# Patient Record
Sex: Female | Born: 1945 | Race: White | Hispanic: No | Smoking: Never smoker
Health system: Southern US, Community
[De-identification: ages and names within clinical notes are randomized; demographics above are authoritative.]

## PROBLEM LIST (undated history)

## (undated) DIAGNOSIS — I69359 Hemiplegia and hemiparesis following cerebral infarction affecting unspecified side: Secondary | ICD-10-CM

## (undated) DIAGNOSIS — I10 Essential (primary) hypertension: Secondary | ICD-10-CM

## (undated) DIAGNOSIS — I69328 Other speech and language deficits following cerebral infarction: Secondary | ICD-10-CM

## (undated) DIAGNOSIS — I639 Cerebral infarction, unspecified: Secondary | ICD-10-CM

## (undated) DIAGNOSIS — E7800 Pure hypercholesterolemia, unspecified: Secondary | ICD-10-CM

## (undated) DIAGNOSIS — F0390 Unspecified dementia without behavioral disturbance: Secondary | ICD-10-CM

## (undated) HISTORY — DX: Other speech and language deficits following cerebral infarction: I69.328

## (undated) HISTORY — DX: Hemiplegia and hemiparesis following cerebral infarction affecting unspecified side: I69.359

---

## 2000-11-10 ENCOUNTER — Other Ambulatory Visit: Admission: RE | Admit: 2000-11-10 | Discharge: 2000-11-10 | Payer: Self-pay | Admitting: Family Medicine

## 2001-07-13 ENCOUNTER — Ambulatory Visit (HOSPITAL_COMMUNITY): Admission: RE | Admit: 2001-07-13 | Discharge: 2001-07-13 | Payer: Self-pay | Admitting: Family Medicine

## 2001-07-13 ENCOUNTER — Encounter: Payer: Self-pay | Admitting: Family Medicine

## 2001-08-27 ENCOUNTER — Inpatient Hospital Stay (HOSPITAL_COMMUNITY): Admission: EM | Admit: 2001-08-27 | Discharge: 2001-08-28 | Payer: Self-pay | Admitting: Internal Medicine

## 2001-08-27 ENCOUNTER — Encounter: Payer: Self-pay | Admitting: Internal Medicine

## 2002-07-14 ENCOUNTER — Ambulatory Visit (HOSPITAL_COMMUNITY): Admission: RE | Admit: 2002-07-14 | Discharge: 2002-07-14 | Payer: Self-pay | Admitting: Family Medicine

## 2002-07-14 ENCOUNTER — Encounter: Payer: Self-pay | Admitting: Family Medicine

## 2003-07-18 ENCOUNTER — Ambulatory Visit (HOSPITAL_COMMUNITY): Admission: RE | Admit: 2003-07-18 | Discharge: 2003-07-18 | Payer: Self-pay | Admitting: Family Medicine

## 2004-07-25 ENCOUNTER — Ambulatory Visit (HOSPITAL_COMMUNITY): Admission: RE | Admit: 2004-07-25 | Discharge: 2004-07-25 | Payer: Self-pay | Admitting: Family Medicine

## 2005-09-30 ENCOUNTER — Ambulatory Visit (HOSPITAL_COMMUNITY): Admission: RE | Admit: 2005-09-30 | Discharge: 2005-09-30 | Payer: Self-pay | Admitting: Obstetrics and Gynecology

## 2006-10-13 ENCOUNTER — Ambulatory Visit (HOSPITAL_COMMUNITY): Admission: RE | Admit: 2006-10-13 | Discharge: 2006-10-13 | Payer: Self-pay | Admitting: Family Medicine

## 2007-11-20 ENCOUNTER — Ambulatory Visit (HOSPITAL_COMMUNITY): Admission: RE | Admit: 2007-11-20 | Discharge: 2007-11-20 | Payer: Self-pay | Admitting: Family Medicine

## 2009-06-09 ENCOUNTER — Ambulatory Visit (HOSPITAL_COMMUNITY): Admission: RE | Admit: 2009-06-09 | Discharge: 2009-06-09 | Payer: Self-pay | Admitting: Family Medicine

## 2010-05-14 ENCOUNTER — Other Ambulatory Visit (HOSPITAL_COMMUNITY): Payer: Self-pay | Admitting: Family Medicine

## 2010-05-14 ENCOUNTER — Encounter: Payer: Self-pay | Admitting: Family Medicine

## 2010-05-14 DIAGNOSIS — Z1239 Encounter for other screening for malignant neoplasm of breast: Secondary | ICD-10-CM

## 2010-06-15 ENCOUNTER — Ambulatory Visit (HOSPITAL_COMMUNITY): Payer: Self-pay

## 2010-09-07 NOTE — Discharge Summary (Signed)
[facility]  Patient:    ALONZO, LOVING Visit Number: [id number] MRN: [id number]          Service Type: MED Location: 2A A209 01 Attending Physician:  John Giovanni Dictated by:   John Giovanni, M.D. Admit Date:  08/27/2001 Discharge Date: 08/28/2001                             Discharge Summary  HISTORY:  A 65 year old was at home folding clothes and on her way to carry some folded up clothes to another room when she suddenly passed out.  She was out for a couple of seconds.  Woke right up, got up, finished folding her clothes but due to severe pain in her distal right arm, she called her family.   She has had three or four syncopal episodes over the course of her lifetime really starting about age 26.  Her sister also had similar spells as did her mother and patients two twin daughters.  Last spell she remembers about 20 years ago.  She is on vitamin E and Lipitor.  She did fracture her right wrist at 65 years old.  Otherwise, she has been healthy.  She had no premonitory signs or symptoms.  She has not even had dizzy spells over the last 20 years.  She woke up alert.  She had been turning and twisting her head and torso during the clothes folding.  PHYSICAL EXAMINATION ON ADMISSION  GENERAL:  Pleasant middle aged woman.  She really is in no acute distress, remained that way.  VITAL SIGNS:  Pulse 89, respiratory rate 20, blood pressure 111/70, temperature 99.1.  PHYSICAL EXAMINATION ON DISCHARGE  GENERAL:  Pleasant middle aged woman, good color.  HEART:  Regular rhythm, rate of 70.  LUNGS:  Clear throughout.  EXTREMITIES:  No edema of the ankles.  HOSPITAL COURSE:  She did fracture a distal right arm (x-ray results not on the chart).  She was seen by Dr. Hilda Lias for this.  He put her on an ice pack and a sling in the right arm with neurovascular checks q.12-2 hours.  She had a two day hospital course.  Was absolutely  asymptomatic, ready for discharge home her second day.  Her blood work including CBC, cardiac enzymes, LFTs was all normal.  Hemoglobin 12.6.  EKG was likewise normal as were her rhythm strips.  DISCHARGE DIAGNOSES: 1. Syncopal episode probably secondary to orthostatic hypotension since the    episode was very rapid, had no premonitory signs or symptoms, and no    postictal changes and is found in four other family members. 2. Hyperlipidemia. 3. Fracture of the distal right arm.  PLAN:  I have discussed the diagnoses at length with the patient and her daughter.  Plan is for her to continue her home medications and go home and arrange to see cardiology outpatient next week. Dictated by:   John Giovanni, M.D. Attending Physician:  John Giovanni DD:  08/28/01 TD:  08/31/01 Job: [id number]

## 2010-09-07 NOTE — Consult Note (Signed)
[facility]  Patient:    Shawna Banks, Shawna Banks Visit Number: [id number] MRN: [id number]          Service Type: MED Location: 2A A209 01 Attending Physician:  Milana Obey Dictated by:   Darreld Mclean, M.D. Admit Date:  08/27/2001 Discharge Date: 08/28/2001                            Consultation Report  INTRODUCTION:  Seen in the ER on consultation requested by Dr. Sherwood Gambler. The patient fell at home.  CHIEF COMPLAINT:  "I hurt my right wrist."  HISTORY OF PRESENT ILLNESS:  She was walking in her dining room, slipped and fell for some reason, she does not know why, landed on her right wrist. She is right-hand dominant. She has a fracture, Smith-type, so called reverse Colles fracture of the right distal radius. She may also have a small fracture of the navicular scaphoid bone, hard to say from these x-rays.  No other injuries. She does not know why she became syncopal. Drs. Sudie Bailey and Renard Matter will admit her today for further evaluation for the syncopal episode. She complains of no other pain, no other difficulty.  I have reviewed of their ER record completed by the nurse and the physician.  These are herein incorporated by reference, I will not repeat these at this time. I have seen the EKG strip as well and what labs we do have back.  Right wrist is swollen and tender, painful, decreased range of motion.  IMPRESSION:  Fracture, distal radius, right, Smith-type, reverse Colles.  I explained to the patient the findings and the need for reduction under closed means.  She appeared to understand and agree to this.  PROCEDURE:  Right wrist has been prepped and draped. Plain Xylocaine 1% was used as a hematoma block. Once this had sustained anesthesia, underwent closed reduction.  The patient was placed in a sugar-tong splint on the right. We are now awaiting post reduction x-rays. Instructions are given for ice, elevation, and sling. I will see  the patient in the office next week, she is admitted now. I will check her in the morning in the hospital. Dictated by:   Darreld Mclean, M.D. Attending Physician:  Milana Obey DD:  08/27/01 TD:  08/29/01 Job: [id number]

## 2010-09-07 NOTE — H&P (Signed)
[facility]  Patient:    Shawna Banks, Shawna Banks Visit Number: [id number] MRN: [id number]          Service Type: MED Location: 2A A209 01 Attending Physician:  Milana Obey Dictated by:   Butch Penny, M.D. Admit Date:  08/27/2001 Discharge Date: 08/28/2001                           History and Physical  HISTORY OF PRESENT ILLNESS:  The patient is a 65 year old female who was admitted through the ED after having presented there with an episode of syncope which occurred at home.  Apparently, she lost consciousness.  Had never had a previous history of any problems of this sort.  Apparently, fell and fractured her right radius.  She was seen in consultation by Dr. Hilda Lias.  FAMILY HISTORY:  Noncontributory.  SOCIAL HISTORY:  The patient does not smoke or drink alcohol.  PAST MEDICAL HISTORY:  Essentially uneventful.  No prior surgeries.  No prior heart problems.  MEDICATIONS:  Lipitor 10 mg, questionable.  ALLERGIES:  No known allergies.  REVIEW OF SYSTEMS:  HEENT:  Negative.  CARDIOPULMONARY:  No chest pain, dyspnea, cough.  GASTROINTESTINAL:  No bowel irregularity or bleeding. GENITOURINARY:  No dysuria or hematuria.  PHYSICAL EXAMINATION:  GENERAL:  Alert female.  VITAL SIGNS:  Blood pressure 111/70, respirations 20, pulse 99, temperature 99.1.  HEENT:  Eyes PERRLA.  TMs negative.  Oropharynx benign.  NECK:  Supple.  No JVD or thyroid abnormalities.  LUNGS:  Clear to P&A.  HEART:  Regular rhythm.  No murmurs.  ABDOMEN:  No palpable organs or masses.  SKIN:  Warm and dry.  EXTREMITIES:  The patient has a marked deformity of the right wrist.  ASSESSMENT:  The patient is admitted with Colles fracture right wrist and episode of syncope, etiology undetermined. Dictated by:   Butch Penny, M.D. Attending Physician:  Milana Obey DD:  08/27/01 TD:  08/29/01 Job: [id number]

## 2010-10-03 ENCOUNTER — Telehealth: Payer: Self-pay

## 2010-10-03 ENCOUNTER — Other Ambulatory Visit (HOSPITAL_COMMUNITY): Payer: Self-pay | Admitting: Family Medicine

## 2010-10-03 DIAGNOSIS — M810 Age-related osteoporosis without current pathological fracture: Secondary | ICD-10-CM

## 2010-10-03 NOTE — Telephone Encounter (Signed)
Contacted pt to schedule screening colonoscopy per Dr. Michelle Nasuti referral. She said she does not want to schedule now. She is working and trying to do all that she can, and she is the only one to pay the bills. Will call if decides to do it. Faxing info back to Dr. Sudie Bailey to make him aware of her decision.

## 2010-10-11 ENCOUNTER — Other Ambulatory Visit (HOSPITAL_COMMUNITY): Payer: Self-pay

## 2010-10-11 ENCOUNTER — Ambulatory Visit (HOSPITAL_COMMUNITY): Payer: Self-pay

## 2011-11-11 ENCOUNTER — Other Ambulatory Visit (HOSPITAL_COMMUNITY): Payer: Self-pay | Admitting: Family Medicine

## 2011-11-11 DIAGNOSIS — Z139 Encounter for screening, unspecified: Secondary | ICD-10-CM

## 2011-11-25 ENCOUNTER — Other Ambulatory Visit (HOSPITAL_COMMUNITY): Payer: Self-pay

## 2011-11-25 ENCOUNTER — Ambulatory Visit (HOSPITAL_COMMUNITY)
Admission: RE | Admit: 2011-11-25 | Discharge: 2011-11-25 | Disposition: A | Discharge status: Home / Self Care | Payer: Medicare Other | Source: Ambulatory Visit | Attending: Family Medicine | Admitting: Family Medicine

## 2011-11-25 DIAGNOSIS — Z1231 Encounter for screening mammogram for malignant neoplasm of breast: Secondary | ICD-10-CM | POA: Insufficient documentation

## 2011-11-25 DIAGNOSIS — Z139 Encounter for screening, unspecified: Secondary | ICD-10-CM

## 2012-02-12 ENCOUNTER — Telehealth: Payer: Self-pay

## 2012-02-12 NOTE — Telephone Encounter (Signed)
Called pt. She is busy now and would like to wait a couple of weeks and she will call me back.

## 2012-03-17 NOTE — Telephone Encounter (Signed)
Pt does not wish to schedule at this time. She is working some and does not want to miss out on that. She will call when she is ready. Letter to PCP.

## 2012-03-24 ENCOUNTER — Other Ambulatory Visit (HOSPITAL_COMMUNITY): Payer: Self-pay | Admitting: Family Medicine

## 2012-03-24 DIAGNOSIS — Z139 Encounter for screening, unspecified: Secondary | ICD-10-CM

## 2012-04-06 ENCOUNTER — Ambulatory Visit (HOSPITAL_COMMUNITY)
Admission: RE | Admit: 2012-04-06 | Discharge: 2012-04-06 | Disposition: A | Discharge status: Home / Self Care | Payer: Medicare Other | Source: Ambulatory Visit | Attending: Family Medicine | Admitting: Family Medicine

## 2012-04-06 DIAGNOSIS — M810 Age-related osteoporosis without current pathological fracture: Secondary | ICD-10-CM | POA: Insufficient documentation

## 2012-04-06 DIAGNOSIS — Z139 Encounter for screening, unspecified: Secondary | ICD-10-CM

## 2012-11-05 ENCOUNTER — Other Ambulatory Visit (HOSPITAL_COMMUNITY): Payer: Self-pay | Admitting: Family Medicine

## 2012-11-05 DIAGNOSIS — Z139 Encounter for screening, unspecified: Secondary | ICD-10-CM

## 2012-11-23 ENCOUNTER — Ambulatory Visit (HOSPITAL_COMMUNITY): Payer: Medicare Other

## 2012-11-26 ENCOUNTER — Ambulatory Visit (HOSPITAL_COMMUNITY)
Admission: RE | Admit: 2012-11-26 | Discharge: 2012-11-26 | Disposition: A | Discharge status: Home / Self Care | Payer: Medicare Other | Source: Ambulatory Visit | Attending: Family Medicine | Admitting: Family Medicine

## 2012-11-26 DIAGNOSIS — Z139 Encounter for screening, unspecified: Secondary | ICD-10-CM

## 2012-11-26 DIAGNOSIS — Z1231 Encounter for screening mammogram for malignant neoplasm of breast: Secondary | ICD-10-CM | POA: Insufficient documentation

## 2013-04-02 ENCOUNTER — Other Ambulatory Visit: Payer: Self-pay | Admitting: Neurology

## 2013-04-02 DIAGNOSIS — F0390 Unspecified dementia without behavioral disturbance: Secondary | ICD-10-CM

## 2013-04-08 ENCOUNTER — Ambulatory Visit (HOSPITAL_COMMUNITY)
Admission: RE | Admit: 2013-04-08 | Discharge: 2013-04-08 | Disposition: A | Discharge status: Home / Self Care | Payer: Medicare Other | Source: Ambulatory Visit | Attending: Neurology | Admitting: Neurology

## 2013-04-08 DIAGNOSIS — F0390 Unspecified dementia without behavioral disturbance: Secondary | ICD-10-CM

## 2013-04-08 DIAGNOSIS — I6789 Other cerebrovascular disease: Secondary | ICD-10-CM | POA: Insufficient documentation

## 2013-04-08 DIAGNOSIS — G319 Degenerative disease of nervous system, unspecified: Secondary | ICD-10-CM | POA: Insufficient documentation

## 2014-02-21 ENCOUNTER — Encounter (HOSPITAL_COMMUNITY): Payer: Self-pay | Admitting: *Deleted

## 2014-02-21 ENCOUNTER — Emergency Department (HOSPITAL_COMMUNITY)
Admission: EM | Admit: 2014-02-21 | Discharge: 2014-02-21 | Disposition: A | Discharge status: Home / Self Care | Payer: Medicare Other | Attending: Emergency Medicine | Admitting: Emergency Medicine

## 2014-02-21 ENCOUNTER — Emergency Department (HOSPITAL_COMMUNITY): Payer: Medicare Other

## 2014-02-21 DIAGNOSIS — S4992XA Unspecified injury of left shoulder and upper arm, initial encounter: Secondary | ICD-10-CM | POA: Diagnosis present

## 2014-02-21 DIAGNOSIS — Y92008 Other place in unspecified non-institutional (private) residence as the place of occurrence of the external cause: Secondary | ICD-10-CM | POA: Diagnosis not present

## 2014-02-21 DIAGNOSIS — F0390 Unspecified dementia without behavioral disturbance: Secondary | ICD-10-CM | POA: Insufficient documentation

## 2014-02-21 DIAGNOSIS — R21 Rash and other nonspecific skin eruption: Secondary | ICD-10-CM | POA: Insufficient documentation

## 2014-02-21 DIAGNOSIS — E780 Pure hypercholesterolemia: Secondary | ICD-10-CM | POA: Insufficient documentation

## 2014-02-21 DIAGNOSIS — I10 Essential (primary) hypertension: Secondary | ICD-10-CM | POA: Insufficient documentation

## 2014-02-21 DIAGNOSIS — W109XXA Fall (on) (from) unspecified stairs and steps, initial encounter: Secondary | ICD-10-CM | POA: Insufficient documentation

## 2014-02-21 DIAGNOSIS — Y9389 Activity, other specified: Secondary | ICD-10-CM | POA: Insufficient documentation

## 2014-02-21 DIAGNOSIS — T1490XA Injury, unspecified, initial encounter: Secondary | ICD-10-CM

## 2014-02-21 DIAGNOSIS — S42292A Other displaced fracture of upper end of left humerus, initial encounter for closed fracture: Secondary | ICD-10-CM | POA: Diagnosis not present

## 2014-02-21 DIAGNOSIS — Z79899 Other long term (current) drug therapy: Secondary | ICD-10-CM | POA: Insufficient documentation

## 2014-02-21 HISTORY — DX: Unspecified dementia, unspecified severity, without behavioral disturbance, psychotic disturbance, mood disturbance, and anxiety: F03.90

## 2014-02-21 HISTORY — DX: Pure hypercholesterolemia, unspecified: E78.00

## 2014-02-21 HISTORY — DX: Essential (primary) hypertension: I10

## 2014-02-21 MED ORDER — HYDROMORPHONE HCL 1 MG/ML IJ SOLN
0.5000 mg | Freq: Once | INTRAMUSCULAR | Status: AC
  Administered 2014-02-21: 0.5 mg via INTRAVENOUS
  Filled 2014-02-21: qty 1

## 2014-02-21 MED ORDER — OXYCODONE-ACETAMINOPHEN 5-325 MG PO TABS: 1.0000 | ORAL_TABLET | ORAL | Status: DC | PRN

## 2014-02-21 MED ORDER — SODIUM CHLORIDE 0.9 % IV BOLUS (SEPSIS)
500.0000 mL | Freq: Once | INTRAVENOUS | Status: AC
  Administered 2014-02-21: 500 mL via INTRAVENOUS

## 2014-02-21 MED ORDER — ONDANSETRON HCL 4 MG/2ML IJ SOLN
4.0000 mg | Freq: Once | INTRAMUSCULAR | Status: DC
  Filled 2014-02-21: qty 2

## 2014-02-21 MED ORDER — BACITRACIN ZINC 500 UNIT/GM EX OINT
TOPICAL_OINTMENT | CUTANEOUS | Status: AC
  Administered 2014-02-21: 1
  Filled 2014-02-21: qty 0.9

## 2014-02-21 MED ORDER — MORPHINE SULFATE 4 MG/ML IJ SOLN
4.0000 mg | Freq: Once | INTRAMUSCULAR | Status: AC
  Administered 2014-02-21: 4 mg via INTRAVENOUS
  Filled 2014-02-21: qty 1

## 2014-02-21 MED ORDER — ONDANSETRON HCL 4 MG/2ML IJ SOLN
4.0000 mg | Freq: Once | INTRAMUSCULAR | Status: AC
  Administered 2014-02-21: 4 mg via INTRAVENOUS

## 2014-02-21 NOTE — ED Notes (Signed)
MD at bedside. 

## 2014-02-21 NOTE — Discharge Instructions (Signed)
You have fractured your humerus bone. Sling, ice, pain medicine, follow-up with orthopedic doctor. Phone number given.

## 2014-02-21 NOTE — ED Provider Notes (Signed)
CSN: [id number]     Arrival date & time 02/21/14  [id number] History  This chart was scribed for Shawna HutchingBrian Brooks Stotz, MD by Tonye RoyaltyJoshua Chen, ED Scribe. This patient was seen in room APA14/APA14 and the patient's care was started at 9:33 AM.    Chief Complaint  Patient presents with  . Shoulder Injury   The history is provided by the patient. No language interpreter was used.    HPI Comments: Shawna Banks is a 68 y.o. female who presents to the Emergency Department complaining of left shoulder pain since falling down a few steps and landing on concrete. She also reports a wound to her right forearm sustained on falling. She denies pain to her hips, knees, or elsewhere. She denies striking her head or LOC.no head or neck trauma. Severity is moderate. Positioning and palpation makes pain worse.  Past Medical History  Diagnosis Date  . Hypertension   . Hypercholesteremia   . Dementia    History reviewed. No pertinent past surgical history. No family history on file. History  Substance Use Topics  . Smoking status: Never Smoker   . Smokeless tobacco: Not on file  . Alcohol Use: No   OB History    No data available     Review of Systems A complete 10 system review of systems was obtained and all systems are negative except as noted in the HPI and PMH.   Allergies  Review of patient's allergies indicates no known allergies.  Home Medications   Prior to Admission medications   Medication Sig Start Date End Date Taking? Authorizing Provider  donepezil (ARICEPT) 10 MG tablet Take 10 mg by mouth at bedtime.   Yes Historical Provider, MD  enalapril (VASOTEC) 20 MG tablet Take 20 mg by mouth daily.   Yes Historical Provider, MD  simvastatin (ZOCOR) 40 MG tablet Take 40 mg by mouth daily.   Yes Historical Provider, MD  Vitamin D, Ergocalciferol, (DRISDOL) 50000 UNITS CAPS capsule Take 50,000 Units by mouth every 7 (seven) days.   Yes Historical Provider, MD  Cholecalciferol (VITAMIN D-3) 1000 UNITS  CAPS Take 1 capsule by mouth daily.    Historical Provider, MD  oxyCODONE-acetaminophen (PERCOCET) 5-325 MG per tablet Take 1 tablet by mouth every 4 (four) hours as needed. 02/21/14   Shawna HutchingBrian Britney Newstrom, MD   BP 177/80 mmHg  Pulse 83  Temp(Src) 98.1 F (36.7 C) (Oral)  Resp 16  Ht 5\' 4"  (1.626 m)  Wt 125 lb (56.7 kg)  BMI 21.45 kg/m2  SpO2 99% Physical Exam  Constitutional: She is oriented to person, place, and time. She appears well-developed and well-nourished.  HENT:  Head: Normocephalic and atraumatic.  Eyes: Conjunctivae and EOM are normal. Pupils are equal, round, and reactive to light.  Neck: Normal range of motion. Neck supple.  Cardiovascular: Normal rate, regular rhythm and normal heart sounds.   Pulmonary/Chest: Effort normal and breath sounds normal.  Abdominal: Soft. Bowel sounds are normal.  Musculoskeletal: Normal range of motion.  Fullness in her left anterior shoulder  Neurological: She is alert and oriented to person, place, and time.  Skin: Skin is warm and dry.  3cm superficial gash on right posterior medial forearm  Psychiatric: She has a normal mood and affect. Her behavior is normal.  Nursing note and vitals reviewed.   ED Course  Procedures (including critical care time)  DIAGNOSTIC STUDIES: Oxygen Saturation is 99% on room air, normal by my interpretation.    COORDINATION OF CARE: 9:37 AM Discussed  treatment plan with patient at beside, including x-ray and pain medication. Discussed with them the possibility that her shoulder is dislocated, which would require reduction. The patient agrees with the plan and has no further questions at this time.   Labs Review Labs Reviewed - No data to display  Imaging Review Dg Shoulder Left  02/21/2014   CLINICAL DATA:  Status post fall down steps at home this morning striking concrete; now with limited range of motion as well shoulder pain  EXAM: LEFT SHOULDER - 2+ VIEW  COMPARISON:  None.  FINDINGS: The patient has  sustained an acute impacted fracture of the neck of the humerus. There is no dislocation of the glenohumeral joint. The bony glenoid as well as the coracoid, acromion, and visualized portions of the left clavicle are intact. The body of the scapula is normal where visualized.  IMPRESSION: The patient has sustained an acute comminuted impacted fracture of the left humeral neck.   Electronically Signed   By: David  SwazilandJordan   On: 02/21/2014 10:04     EKG Interpretation None      MDM   Final diagnoses:  Humeral head fracture, left, closed, initial encounter    Plain films of left shoulder show a comminuted impacted fracture of the left humeral neck. Sling immobilizer, ice, pain medication, referral to orthopedics. Discussed with patient and her neighbor    Shawna HutchingBrian Jaquese Irving, MD 02/21/14 [id number]

## 2014-02-21 NOTE — ED Notes (Signed)
CSM remains intact after application of shoulder immobilizer.

## 2014-02-21 NOTE — ED Notes (Signed)
Pt fell out of back door, down ~ 4 steps and landed on concrete. Pt states pain to left shoulder. Abrasion to right forearm.

## 2014-02-28 ENCOUNTER — Ambulatory Visit (INDEPENDENT_AMBULATORY_CARE_PROVIDER_SITE_OTHER): Payer: Medicare Other | Admitting: Orthopedic Surgery

## 2014-02-28 ENCOUNTER — Encounter: Payer: Self-pay | Admitting: Orthopedic Surgery

## 2014-02-28 VITALS — BP 160/101 | Ht 64.0 in | Wt 125.0 lb

## 2014-02-28 DIAGNOSIS — S42202A Unspecified fracture of upper end of left humerus, initial encounter for closed fracture: Secondary | ICD-10-CM

## 2014-02-28 DIAGNOSIS — S42209A Unspecified fracture of upper end of unspecified humerus, initial encounter for closed fracture: Secondary | ICD-10-CM | POA: Insufficient documentation

## 2014-02-28 MED ORDER — OXYCODONE-ACETAMINOPHEN 5-325 MG PO TABS: 1.0000 | ORAL_TABLET | ORAL | Status: DC | PRN

## 2014-02-28 NOTE — Progress Notes (Signed)
Patient ID: Shawna FlockBarbara C Nazareno, female   DOB: Jul 07, 1945, 68 y.o.   MRN: [id number] Patient ID: Shawna FlockBarbara C Urquiza, female   DOB: Jul 07, 1945, 68 y.o.   MRN: [id number]  Chief Complaint  Patient presents with  . Arm Injury    LEFT HUMERUS FRACTURE, DOI 02/21/14    HPI Shawna FlockBarbara C Pinon is a 68 y.o. female.  HPI    Patient ID: Shawna FlockBarbara C Hudock, female   DOB: Jul 07, 1945, 68 y.o.   MRN: [id number] Chief Complaint  Patient presents with  . Arm Injury    LEFT HUMERUS FRACTURE, DOI 02/21/14    History 68 year old female presents with a left proximal humerus fracture sustained on 02/21/2014 secondary to a fall down some stairs at her home. She complains of aching pain associated with swelling of the left arm and 6 out of 10 intensity. Her pain is relieved by Percocet and worse with movement. The patient's review of systems is reported as night sweats and all others were negative.  Past Medical History  Diagnosis Date  . Hypertension   . Hypercholesteremia   . Dementia     History reviewed. No pertinent past surgical history.  History reviewed. No pertinent family history.  Social History History  Substance Use Topics  . Smoking status: Never Smoker   . Smokeless tobacco: Not on file  . Alcohol Use: No    No Known Allergies  Current Outpatient Prescriptions  Medication Sig Dispense Refill  . Cholecalciferol (VITAMIN D-3) 1000 UNITS CAPS Take 1 capsule by mouth daily.    Marland Kitchen. donepezil (ARICEPT) 10 MG tablet Take 10 mg by mouth at bedtime.    . enalapril (VASOTEC) 20 MG tablet Take 20 mg by mouth daily.    Marland Kitchen. oxyCODONE-acetaminophen (PERCOCET) 5-325 MG per tablet Take 1 tablet by mouth every 4 (four) hours as needed. 25 tablet 0  . simvastatin (ZOCOR) 40 MG tablet Take 40 mg by mouth daily.    . Vitamin D, Ergocalciferol, (DRISDOL) 50000 UNITS CAPS capsule Take 50,000 Units by mouth every 7 (seven) days.     No current facility-administered medications for this visit.    Review of  Systems Review of Systems  Blood pressure 160/101, height 5\' 4"  (1.626 m), weight 125 lb (56.7 kg).  Physical Exam Physical Exam BP 160/101 mmHg  Ht 5\' 4"  (1.626 m)  Wt 125 lb (56.7 kg)  BMI 21.45 kg/m2 The patient's frame is small she has no developmental abnormalities she is oriented to person place and time her mood is pleasant. She ambulates without assistive device. Her left proximal humerus is tender and swollen just decreased range of motion there as well as the elbow and hand with swelling noted in the hand and ecchymosis tracking down the arm to the area of the elbow. Her motor exam reveals normal muscle tone in the left arm with normal range of motion strength in her left hand skin as described pulses are normal sensation is normal over the left proximal shoulder deltoid and upper arm  Data Reviewed Independent x-ray analysis includes axillary and AP lateral views of the left shoulder nondisplaced proximal humerus fracture which meets the non-operative criteria  Assessment    Non-displaced proximal humerus fracture treated closed methods with sling-and-swathe     Plan    3 weeks x-ray continue sling and swathe continue Percocet for pain No orders of the defined types were placed in this encounter.           Fuller CanadaStanley Harrison 02/28/2014, 3:39 PM

## 2014-02-28 NOTE — Progress Notes (Signed)
Patient ID: Shawna FlockBarbara C Banks, female   DOB: Sep 04, 1945, 68 y.o.   MRN: [id number] Chief Complaint  Patient presents with  . Arm Injury    LEFT HUMERUS FRACTURE, DOI 02/21/14    History 68 year old female presents with a left proximal humerus fracture sustained on 02/21/2014 secondary to a fall down some stairs at her home. She complains of aching pain associated with swelling of the left arm and 6 out of 10 intensity. Her pain is relieved by Percocet and worse with movement. The patient's review of systems is reported as night sweats and all others were negative.

## 2014-03-22 ENCOUNTER — Ambulatory Visit (INDEPENDENT_AMBULATORY_CARE_PROVIDER_SITE_OTHER): Payer: Medicare Other

## 2014-03-22 ENCOUNTER — Encounter: Payer: Self-pay | Admitting: Orthopedic Surgery

## 2014-03-22 ENCOUNTER — Ambulatory Visit (INDEPENDENT_AMBULATORY_CARE_PROVIDER_SITE_OTHER): Payer: Self-pay | Admitting: Orthopedic Surgery

## 2014-03-22 VITALS — BP 152/82 | Ht 64.0 in | Wt 125.0 lb

## 2014-03-22 DIAGNOSIS — S4292XD Fracture of left shoulder girdle, part unspecified, subsequent encounter for fracture with routine healing: Secondary | ICD-10-CM

## 2014-03-22 DIAGNOSIS — S42202D Unspecified fracture of upper end of left humerus, subsequent encounter for fracture with routine healing: Secondary | ICD-10-CM

## 2014-03-22 NOTE — Progress Notes (Signed)
Patient ID: Dustin FlockBarbara C Banks, female   DOB: May 25, 1945, 68 y.o.   MRN: [id number] Chief Complaint  Patient presents with  . Follow-up    3 week recheck + xray Left humerus fracture, DOI 02/21/14   BP 152/82 mmHg  Ht 5\' 4"  (1.626 m)  Wt 125 lb (56.7 kg)  BMI 21.45 kg/m2 Chief Complaint  Patient presents with  . Follow-up    3 week recheck + xray Left humerus fracture, DOI 02/21/14    Patient has had x-ray today and her x-ray is stable proximal humerus fracture with appropriate healing  She was treated with a shoulder immobilizer  She is taking minimal pain medication  She's neurovascularly intact  I interpret her x-ray is healing fracture proximal humerus stable  Start therapy  Orders Placed This Encounter  Procedures  . DG Shoulder Left    Standing Status: Future     Number of Occurrences: 1     Standing Expiration Date: 05/24/2015    Order Specific Question:  Reason for Exam (SYMPTOM  OR DIAGNOSIS REQUIRED)    Answer:  shoulder fracture    Order Specific Question:  Preferred imaging location?    Answer:  External  . Ambulatory referral to Occupational Therapy    Referral Priority:  Routine    Referral Type:  Occupational Therapy    Referral Reason:  Specialty Services Required    Requested Specialty:  Occupational Therapy    Number of Visits Requested:  1   Return 6 weeks check range of motion

## 2014-03-22 NOTE — Patient Instructions (Signed)
Call to arrange therapy at [facility] outpatient dept 

## 2014-03-25 ENCOUNTER — Encounter (HOSPITAL_COMMUNITY): Payer: Self-pay

## 2014-03-25 ENCOUNTER — Ambulatory Visit (HOSPITAL_COMMUNITY)
Admission: RE | Admit: 2014-03-25 | Discharge: 2014-03-25 | Disposition: A | Discharge status: Home / Self Care | Payer: Medicare Other | Source: Ambulatory Visit | Attending: Orthopedic Surgery | Admitting: Orthopedic Surgery

## 2014-03-25 DIAGNOSIS — R29898 Other symptoms and signs involving the musculoskeletal system: Secondary | ICD-10-CM

## 2014-03-25 DIAGNOSIS — Z5189 Encounter for other specified aftercare: Secondary | ICD-10-CM | POA: Insufficient documentation

## 2014-03-25 DIAGNOSIS — M25612 Stiffness of left shoulder, not elsewhere classified: Secondary | ICD-10-CM | POA: Diagnosis not present

## 2014-03-25 DIAGNOSIS — M6281 Muscle weakness (generalized): Secondary | ICD-10-CM | POA: Insufficient documentation

## 2014-03-25 DIAGNOSIS — M25512 Pain in left shoulder: Secondary | ICD-10-CM | POA: Insufficient documentation

## 2014-03-25 NOTE — Patient Instructions (Addendum)
SHOULDER: Flexion On Table   Place hands on table, elbows straight. Lean forward so that hand slide forward on the table.  Smooth stretch. Complete for 1-2 minutes, 2-3 times per day.   Abduction (Passive)   With arm out to side, resting on table, lean to the side, stretching arm away from the body. Smooth stretch. Complete for 1-2 minutes, 2-3 times per day.  Due to increased pain, complete in standing at kitchen counter initially.  Copyright  VHI. All rights reserved.     Internal Rotation (Assistive)   Seated with elbow bent at right angle and held against side, slide arm on table surface in an inward arc. Right arm to provide assist to left as needed. Smooth stretch. Complete for 1-2 minutes, 2-3 times per day. Activity: Use this motion to brush crumbs off the table.  Copyright  VHI. All rights reserved.     Marry GuanMarie Rawlings Kassadi Presswood, MS, OTR/L [facility] [phone]

## 2014-03-25 NOTE — Therapy (Signed)
[facility] [address] [place], [place], [address] Phone: [phone]   Fax:  [phone]  Occupational Therapy Evaluation  Patient Details  Name: Shawna Banks MRN: [id number] Date of Birth: July 13, 1945  Encounter Date: 03/25/2014      OT End of Session - 03/25/14 1159    Visit Number 1   Number of Visits 24   Date for OT Re-Evaluation 04/22/14   Authorization Type AARP Medicare Complete   Authorization Time Period Before 10th visit   Authorization - Visit Number 1   Authorization - Number of Visits 10   OT Start Time 1107   OT Stop Time 1141   OT Time Calculation (min) 34 min   Activity Tolerance Patient tolerated treatment well   Behavior During Therapy [facility] for tasks assessed/performed      Past Medical History  Diagnosis Date  . Hypertension   . Hypercholesteremia   . Dementia     No past surgical history on file.  There were no vitals taken for this visit.  Visit Diagnosis:  Pain in joint, shoulder region, left  Shoulder weakness  Decreased range of motion of shoulder, left      Subjective Assessment - 03/25/14 1146    Symptoms "i just fell. not down the stiars. Just on my arm."   Pertinent History Pt is 68 yo female presenting to outpatient OT s/p left proximal humerus fracture on november 2nd.  pt had gone out her back door and fell on the concrete outside. pt is uncertain as to what happened, but did fall on her L arm.  A friend drove pt to the hospital, where pt recieved w-rays and pain medication. Routine healing underway.  pt is left handed and currenlty has deficits in all ADLs and IADLs.   Limitations progress as tolerated   Special Tests FOTO 38/100   Patient Stated Goals Get so i can use my arm, so that I can write a check   Currently in Pain? No/denies  "it can get to hurting quite a bit"          [facility] Assessment - 03/25/14 1149    Assessment   Diagnosis Left proximal humerus fracture   Onset Date  02/21/14   Precautions   Precautions None   Restrictions   Weight Bearing Restrictions No   Balance Screen   Has the patient fallen in the past 6 months Yes   How many times? 1   Has the patient had a decrease in activity level because of a fear of falling?  No   Is the patient reluctant to leave their home because of a fear of falling?  No   Home  Environment   Family/patient expects to be discharged to: Private residence   Living Arrangements Alone   Available Help at Discharge --  daughters live within 20 minutes   Prior Function   Level of Independence Independent with basic ADLs;Independent with homemaking with ambulation   Vocation Retired   [employer]er[employer]ocation Requirements Formerly at [employer] and [employer]   Leisure walking, shopping   ADL   ADL comments Pt is left handed and is currently not able to use LUE for ADLs. Pt is using RUE for eating, brushing her teeth, doing her hair.  Pt cannot lieft anyting with LUE.   Written Expression   Dominant Hand Left   Vision - History   Baseline Vision Wears glasses all the time   Vision Assessment   Eye  Alignment Within Functional Limits   Cognition   Overall Cognitive Status Within Functional Limits for tasks assessed  WFL, with hx of dementia   Observation/Other Assessments   Observations pt has max fascial restrictions in LUE bicep region.  pt has mod restrictions in upper trap region   Focus on Therapeutic Outcomes (FOTO)  FOTO 38/100 (62% limited)   Sensation   Light Touch Appears Intact   Coordination   Gross Motor Movements are Fluid and Coordinated Not tested   Fine Motor Movements are Fluid and Coordinated Yes   AROM   Overall AROM  Deficits   Overall AROM Comments Not assessed this session   PROM   Overall PROM  Deficits   Overall PROM Comments Assessed in supine, with ER/IR adducted   Left Shoulder Flexion 15 Degrees   Left Shoulder ABduction 45 Degrees   Left Shoulder Internal Rotation 93 Degrees   Left  Shoulder External Rotation 19 Degrees            OT Education - 03/25/14 1158    Education provided Yes   Education Details table slides   Person(s) Educated Patient   Methods Explanation;Demonstration;Handout   Comprehension Verbalized understanding;Returned demonstration          OT Short Term Goals - 03/25/14 1203    OT SHORT TERM GOAL #1   Title Pt will be independent in HEP   Time 4   Period Weeks   Status New   OT SHORT TERM GOAL #2   Title Pt will imporve LUE PROM to [facility] for improved ability to write checks   Time 4   Period Weeks   Status New   OT SHORT TERM GOAL #3   Title Pt will decrease fascial restictions to min-mod in LUE for decreased pain with movement   Time 4   Period Weeks   Status New   OT SHORT TERM GOAL #4   Title Pt will decrease pain in LUE during daily tasks to less than 4/10   Time 4   Period Weeks   Status New          OT Long Term Goals - 03/25/14 1204    OT LONG TERM GOAL #1   Title Pt will achieve highest level of functioning in all ADL, IADL, and leisure tasks using LUE as dominant   Time 8   Period Weeks   Status New   OT LONG TERM GOAL #2   Title Pt will achieve LUE AROM to near [facility] for improved ability to fix her hair using her LUE   Time 8   Period Weeks   Status New   OT LONG TERM GOAL #3   Title Pt will improve strength in LUE to atleast 4/5 for improved abilty to reach into kitchen cabinets   Time 8   Period Weeks   Status New   OT LONG TERM GOAL #4   Title Pt will decrease fascial restictions to minimal in LUE for decreased pain overall during daily tasks.   OT LONG TERM GOAL #5   Title Pt will report pain in LUE during daily tasks of less than 2/10.   Time 8   Period Weeks   Status New          Plan - 03/25/14 1159    Clinical Impression Statement Pt is presenting to outpatient OT d/p left proximal humerus fracture.  Pt has decreased ROM, decreased strength, incresaed fascial restrictions, increased pain  with motion, and  decerased overall LUE functional use, resulting in decreased ADL status.   Rehab Potential Good   OT Frequency 3x / week   OT Duration 8 weeks   OT Treatment/Interventions Self-care/ADL training;Patient/family education;Therapeutic exercise;Therapeutic exercises;Therapeutic activities;Manual Therapy;DME and/or AE instruction;Electrical Stimulation;Passive range of motion;Ultrasound   Plan Pt will benefit from skilled OT services to decerase pain, improve ROM, decrease fascial restictions, increase strength, and promote imporved overall LUE functional use.      Treatment Plan: MFR and manual stretching, PROM, AAROM, AROM, scapular strengthening, proximal shoudler strengthening, general strengthening   OT Home Exercise Plan table slides   Consulted and Agree with Plan of Care Patient          G-Codes - 03/25/14 1206    Functional Limitation Self care   Self Care Current Status [phone]([id number]) At least 60 percent but less than 80 percent impaired, limited or restricted   Self Care Goal Status ([phone]) At least 20 percent but less than 40 percent impaired, limited or restricted         Problem List Patient Active Problem List   Diagnosis Date Noted  . Proximal humerus fracture 02/28/2014    Marry GuanMarie Rawlings Linsey Hirota, MS, OTR/L [facility] [phone] 03/25/2014, 12:11 PM

## 2014-03-29 ENCOUNTER — Encounter (HOSPITAL_COMMUNITY): Payer: Self-pay

## 2014-03-29 ENCOUNTER — Ambulatory Visit (HOSPITAL_COMMUNITY)
Admission: RE | Admit: 2014-03-29 | Discharge: 2014-03-29 | Disposition: A | Discharge status: Home / Self Care | Payer: Medicare Other | Source: Ambulatory Visit | Attending: Orthopedic Surgery | Admitting: Orthopedic Surgery

## 2014-03-29 DIAGNOSIS — M25512 Pain in left shoulder: Secondary | ICD-10-CM

## 2014-03-29 DIAGNOSIS — M25612 Stiffness of left shoulder, not elsewhere classified: Secondary | ICD-10-CM

## 2014-03-29 DIAGNOSIS — R29898 Other symptoms and signs involving the musculoskeletal system: Secondary | ICD-10-CM

## 2014-03-29 DIAGNOSIS — Z5189 Encounter for other specified aftercare: Secondary | ICD-10-CM | POA: Diagnosis not present

## 2014-03-29 NOTE — Therapy (Signed)
[facility] [address] [place], [place], [address] Phone: [phone]   Fax:  [phone]  Occupational Therapy Treatment  Patient Details  Name: Shawna FlockBarbara C Banks MRN: [id number] Date of Birth: Oct 26, 1945  Encounter Date: 03/29/2014      OT End of Session - 03/29/14 1134    Visit Number 2   Number of Visits 24   Date for OT Re-Evaluation 04/22/14   Authorization Type AARP Medicare Complete   Authorization Time Period Before 10th visit   Authorization - Visit Number 2   Authorization - Number of Visits 10   OT Start Time 1106   OT Stop Time 1140   OT Time Calculation (min) 34 min   Activity Tolerance Patient tolerated treatment well   Behavior During Therapy [facility] for tasks assessed/performed      Past Medical History  Diagnosis Date  . Hypertension   . Hypercholesteremia   . Dementia     No past surgical history on file.  There were no vitals taken for this visit.  Visit Diagnosis:  Pain in joint, shoulder region, left  Shoulder weakness  Decreased range of motion of shoulder, left      Subjective Assessment - 03/29/14 1106    Symptoms S: I like that I don't have to wear my sling anymore.    Currently in Pain? No/denies          [facility] Assessment - 03/29/14 1121    Precautions   Precautions None          OT Treatments/Exercises (OP) - 03/29/14 1121    Shoulder Exercises: Supine   Protraction PROM;5 reps   Horizontal ABduction PROM;10 reps   External Rotation PROM;10 reps   Internal Rotation PROM;5 reps   Flexion PROM;5 reps   ABduction PROM;5 reps   Shoulder Exercises: Seated   Elevation AROM;10 reps   Extension AROM;10 reps   Row AROM;10 reps   Shoulder Exercises: Therapy Ball   Flexion 10 reps;Limitations   Flexion Limitations With assist from therapist to decrease speed and increase stretch   ABduction 10 reps;Limitations   ABduction Limitations With assist from therapist to decrease speed and increase  stretch   Modalities   Modalities Cryotherapy   Cryotherapy   Number Minutes Cryotherapy 10 Minutes   Cryotherapy Location Shoulder   Type of Cryotherapy Ice pack   Manual Therapy   Manual Therapy Myofascial release   Myofascial Release Positional release to left anterior deltoid to decrease trigger point pain and relax muscle. Muscle energy techniqure to anterior deltoid to to relax tone and improve range of motion            OT Short Term Goals - 03/29/14 1136    OT SHORT TERM GOAL #1   Title Pt will be independent in HEP   Time 4   Period Weeks   Status On-going   OT SHORT TERM GOAL #2   Title Pt will imporve LUE PROM to [facility] for improved ability to write checks   Time 4   Period Weeks   Status On-going   OT SHORT TERM GOAL #3   Title Pt will decrease fascial restictions to min-mod in LUE for decreased pain with movement   Time 4   Period Weeks   Status On-going   OT SHORT TERM GOAL #4   Title Pt will decrease pain in LUE during daily tasks to less than 4/10   Time 4   Period Weeks   Status  On-going          OT Long Term Goals - 03/29/14 1136    OT LONG TERM GOAL #1   Title Pt will achieve highest level of functioning in all ADL, IADL, and leisure tasks using LUE as dominant   Time 8   Period Weeks   Status On-going   OT LONG TERM GOAL #2   Title Pt will achieve LUE AROM to near [facility] for improved ability to fix her hair using her LUE   Time 8   Period Weeks   Status On-going   OT LONG TERM GOAL #3   Title Pt will improve strength in LUE to atleast 4/5 for improved abilty to reach into kitchen cabinets   Time 8   Period Weeks   Status On-going   OT LONG TERM GOAL #4   Title Pt will decrease fascial restictions to minimal in LUE for decreased pain overall during daily tasks.   Status On-going   OT LONG TERM GOAL #5   Title Pt will report pain in LUE during daily tasks of less than 2/10.   Time 8   Period Weeks   Status On-going          Plan -  03/29/14 1135    Clinical Impression Statement A: Initiated postional release and muscle energy technique. Patient had great reponse to muscle energy technique and was able to increase passive ROM. Added seated AROM exercises and therapy ball as well as ended with ice to decrease pain and inflammation.    Plan P: Cont to work on increase joint mobility. Increase therapy ball reps to 15X.       Problem List Patient Active Problem List   Diagnosis Date Noted  . Proximal humerus fracture 02/28/2014    Limmie PatriciaLaura Emitt Maglione, OTR/L,CBIS  [phone]  03/29/2014, 11:38 AM

## 2014-03-31 ENCOUNTER — Encounter (HOSPITAL_COMMUNITY): Payer: Self-pay

## 2014-03-31 ENCOUNTER — Ambulatory Visit (HOSPITAL_COMMUNITY)
Admission: RE | Admit: 2014-03-31 | Discharge: 2014-03-31 | Disposition: A | Discharge status: Home / Self Care | Payer: Medicare Other | Source: Ambulatory Visit | Attending: Orthopedic Surgery | Admitting: Orthopedic Surgery

## 2014-03-31 DIAGNOSIS — M25612 Stiffness of left shoulder, not elsewhere classified: Secondary | ICD-10-CM

## 2014-03-31 DIAGNOSIS — M25512 Pain in left shoulder: Secondary | ICD-10-CM

## 2014-03-31 DIAGNOSIS — Z5189 Encounter for other specified aftercare: Secondary | ICD-10-CM | POA: Diagnosis not present

## 2014-03-31 DIAGNOSIS — R29898 Other symptoms and signs involving the musculoskeletal system: Secondary | ICD-10-CM

## 2014-03-31 NOTE — Therapy (Signed)
[facility] [address] [place], [place], [address] Phone: [phone]   Fax:  [phone]  Occupational Therapy Treatment  Patient Details  Name: Shawna FlockBarbara C Banks MRN: [id number] Date of Birth: April 18, 1946  Encounter Date: 03/31/2014      OT End of Session - 03/31/14 1327    Visit Number 3   Number of Visits 24   Date for OT Re-Evaluation 04/22/14   Authorization Type AARP Medicare Complete   Authorization Time Period Before 10th visit   Authorization - Visit Number 3   Authorization - Number of Visits 10   OT Start Time 1108   OT Stop Time 1140   OT Time Calculation (min) 32 min   Activity Tolerance Patient tolerated treatment well   Behavior During Therapy [facility] for tasks assessed/performed      Past Medical History  Diagnosis Date  . Hypertension   . Hypercholesteremia   . Dementia     No past surgical history on file.  There were no vitals taken for this visit.  Visit Diagnosis:  Shoulder weakness  Pain in joint, shoulder region, left  Decreased range of motion of shoulder, left      Subjective Assessment - 03/31/14 1127    Symptoms S: My arm didn't hurt as bad as the first session.    Currently in Pain? Yes   Pain Score 2    Pain Location Shoulder   Pain Orientation Left   Pain Descriptors / Indicators Aching   Pain Type Acute pain          [facility] OT Assessment - 03/31/14 1128    Precautions   Precautions None          OT Treatments/Exercises (OP) - 03/31/14 1128    Shoulder Exercises: Supine   Protraction PROM;5 reps   Horizontal ABduction PROM;5 reps   External Rotation PROM;5 reps   Internal Rotation PROM;5 reps   Flexion PROM;5 reps   ABduction PROM;5 reps   Other Supine Exercises Bridges 15X   Shoulder Exercises: Seated   Elevation AROM;10 reps   Extension AROM;10 reps   Row AROM;10 reps   Shoulder Exercises: Therapy Ball   Flexion 15 reps;Limitations   Flexion Limitations With assist from  therapist to decrease speed and increase stretch   ABduction 15 reps;Limitations   ABduction Limitations With assist from therapist to decrease speed and increase stretch   Manual Therapy   Manual Therapy Myofascial release   Myofascial Release Muscle energy techniqure to anterior deltoid to to relax tone and improve range of motion             OT Short Term Goals - 03/31/14 1129    OT SHORT TERM GOAL #1   Title Pt will be independent in HEP   Status On-going   OT SHORT TERM GOAL #2   Title Pt will imporve LUE PROM to [facility] for improved ability to write checks   Status On-going   OT SHORT TERM GOAL #3   Title Pt will decrease fascial restictions to min-mod in LUE for decreased pain with movement   Status On-going   OT SHORT TERM GOAL #4   Title Pt will decrease pain in LUE during daily tasks to less than 4/10   Status On-going          OT Long Term Goals - 03/31/14 1129    OT LONG TERM GOAL #1   Title Pt will achieve highest level of functioning in all ADL, IADL,  and leisure tasks using LUE as dominant   Status On-going   OT LONG TERM GOAL #2   Title Pt will achieve LUE AROM to [facility] for improved ability to fix her hair using her LUE   Status On-going   OT LONG TERM GOAL #3   Title Pt will improve strength in LUE to atleast 4/5 for improved abilty to reach into kitchen cabinets   Status On-going   OT LONG TERM GOAL #4   Title Pt will decrease fascial restictions to minimal in LUE for decreased pain overall during daily tasks.   Status On-going   OT LONG TERM GOAL #5   Title Pt will report pain in LUE during daily tasks of less than 2/10.   Status On-going          Plan - 03/31/14 1327    Clinical Impression Statement A: Increased therapy ball reps to 15X and added bridges. Patient tolerated well. Conts to have pain during passives stretching.    Plan P: Cont to work on increasing joint mobility during passives stretching and decreasing pain level .          Problem List Patient Active Problem List   Diagnosis Date Noted  . Proximal humerus fracture 02/28/2014    Limmie PatriciaLaura Jodi Criscuolo, OTR/L,CBIS  [phone]  03/31/2014, 1:29 PM

## 2014-04-01 ENCOUNTER — Ambulatory Visit (HOSPITAL_COMMUNITY)
Admission: RE | Admit: 2014-04-01 | Discharge: 2014-04-01 | Disposition: A | Discharge status: Home / Self Care | Payer: Medicare Other | Source: Ambulatory Visit | Attending: Family Medicine | Admitting: Family Medicine

## 2014-04-01 DIAGNOSIS — M25612 Stiffness of left shoulder, not elsewhere classified: Secondary | ICD-10-CM

## 2014-04-01 DIAGNOSIS — R29898 Other symptoms and signs involving the musculoskeletal system: Secondary | ICD-10-CM

## 2014-04-01 DIAGNOSIS — M25512 Pain in left shoulder: Secondary | ICD-10-CM

## 2014-04-01 DIAGNOSIS — Z5189 Encounter for other specified aftercare: Secondary | ICD-10-CM | POA: Diagnosis not present

## 2014-04-01 NOTE — Therapy (Signed)
[facility] [address] [place], [place], [address] Phone: [phone]   Fax:  [phone]  Occupational Therapy Treatment  Patient Details  Name: Shawna FlockBarbara C Banks MRN: [id number] Date of Birth: 07-Jul-1945  Encounter Date: 04/01/2014      OT End of Session - 04/01/14 1055    Visit Number 4   Date for OT Re-Evaluation 04/22/14   Authorization Type AARP Medicare Complete   Authorization Time Period Before 10th visit   Authorization - Visit Number 4   Authorization - Number of Visits 10   OT Start Time 1020   OT Stop Time 1053   OT Time Calculation (min) 33 min   Activity Tolerance Patient tolerated treatment well   Behavior During Therapy [facility] for tasks assessed/performed      Past Medical History  Diagnosis Date  . Hypertension   . Hypercholesteremia   . Dementia     No past surgical history on file.  There were no vitals taken for this visit.  Visit Diagnosis:  Shoulder weakness  Pain in joint, shoulder region, left  Decreased range of motion of shoulder, left      Subjective Assessment - 04/01/14 1021    Symptoms S:  Im trying to use my arm as much as possible at home.  Its hard to make the bed.   Limitations progress as tolerated   Currently in Pain? No/denies   Pain Score 0-No pain          [facility] OT Assessment - 03/31/14 1128    Precautions   Precautions None          OT Treatments/Exercises (OP) - 04/01/14 0001    ADLs   Overall ADLs donned and doffed coat independently.  initially placed right arm in coat before left and realized that she needed to complete in opposite sequence   Exercises   Exercises Shoulder   Shoulder Exercises: Supine   Protraction PROM;10 reps   Horizontal ABduction PROM;10 reps   External Rotation PROM;10 reps   Internal Rotation PROM;10 reps   Flexion PROM;10 reps   ABduction PROM;10 reps   Other Supine Exercises Bridges 15X   Shoulder Exercises: Seated   Elevation AROM;15 reps    Extension AROM;15 reps   Row AROM;15 reps   Shoulder Exercises: Therapy Ball   Flexion 15 reps;Limitations   Flexion Limitations With assist from therapist to decrease speed and increase stretch   ABduction 15 reps;Limitations   ABduction Limitations With assist from therapist to decrease speed and increase stretch   Shoulder Exercises: Isometric Strengthening   Flexion Supine;3X3"   Extension Supine;3X3"   External Rotation Supine;3X3"   Internal Rotation Supine;3X3"   ABduction Supine;3X3"   ADduction Supine;3X3"   Manual Therapy   Manual Therapy Myofascial release   Myofascial Release Myofascial release, manual stretching to left upper arm, scapular, trapezius, shoulder, and anterior shoulder region.            OT Education - 04/01/14 1055    Education provided Yes   Education Details shoulder retraction   Person(s) Educated Patient   Methods Explanation;Demonstration   Comprehension Verbalized understanding;Returned demonstration          OT Short Term Goals - 04/01/14 1057    OT SHORT TERM GOAL #1   Title Pt will be independent in HEP   Status On-going   OT SHORT TERM GOAL #2   Title Pt will imporve LUE PROM to [facility]FL for improved ability to write checks  Status On-going   OT SHORT TERM GOAL #3   Title Pt will decrease fascial restictions to min-mod in LUE for decreased pain with movement   Status On-going   OT SHORT TERM GOAL #4   Title Pt will decrease pain in LUE during daily tasks to less than 4/10   Status On-going          OT Long Term Goals - 04/01/14 1058    OT LONG TERM GOAL #1   Title Pt will achieve highest level of functioning in all ADL, IADL, and leisure tasks using LUE as dominant   Status On-going   OT LONG TERM GOAL #2   Title Pt will achieve LUE AROM to near [facility] for improved ability to fix her hair using her LUE   Status On-going   OT LONG TERM GOAL #3   Title Pt will improve strength in LUE to atleast 4/5 for improved abilty to reach  into kitchen cabinets   Status On-going   OT LONG TERM GOAL #4   Title Pt will decrease fascial restictions to minimal in LUE for decreased pain overall during daily tasks.   Status On-going   OT LONG TERM GOAL #5   Title Pt will report pain in LUE during daily tasks of less than 2/10.   Status On-going          Plan - 04/01/14 1056    Clinical Impression Statement A: Added isometric strengthening in supine.  Educated patient on improving posture for better shoulder mechanics.  Shoulder very tight with PROM at 90 degrees flexion and abduction.    Plan P: Increase PROM to greater than 90 flexion and abduction.      Problem List Patient Active Problem List   Diagnosis Date Noted  . Proximal humerus fracture 02/28/2014    Shirlean MylarBethany H. Latha Staunton, OTR/L [phone]  04/01/2014, 10:59 AM

## 2014-04-04 ENCOUNTER — Ambulatory Visit (HOSPITAL_COMMUNITY)
Admission: RE | Admit: 2014-04-04 | Discharge: 2014-04-04 | Disposition: A | Discharge status: Home / Self Care | Payer: Medicare Other | Source: Ambulatory Visit | Attending: Family Medicine | Admitting: Family Medicine

## 2014-04-04 DIAGNOSIS — R29898 Other symptoms and signs involving the musculoskeletal system: Secondary | ICD-10-CM

## 2014-04-04 DIAGNOSIS — M25612 Stiffness of left shoulder, not elsewhere classified: Secondary | ICD-10-CM

## 2014-04-04 DIAGNOSIS — M25512 Pain in left shoulder: Secondary | ICD-10-CM

## 2014-04-04 DIAGNOSIS — Z5189 Encounter for other specified aftercare: Secondary | ICD-10-CM | POA: Diagnosis not present

## 2014-04-04 NOTE — Therapy (Signed)
[facility] [address] [place], [place], [address] Phone: [phone]   Fax:  [phone]  Occupational Therapy Treatment  Patient Details  Name: Dustin FlockBarbara C Orrico MRN: [id number] Date of Birth: [birth year]  Encounter Date: 04/04/2014      OT End of Session - 04/04/14 1145    OT Start Time 1106   OT Stop Time 1144   OT Time Calculation (min) 38 min      Past Medical History  Diagnosis Date  . Hypertension   . Hypercholesteremia   . Dementia     No past surgical history on file.  There were no vitals taken for this visit.  Visit Diagnosis:  Shoulder weakness  Pain in joint, shoulder region, left  Decreased range of motion of shoulder, left      Subjective Assessment - 04/04/14 1107    Symptoms S:  My arm was a little sore after you worked on it, then it got better.    Limitations progress as tolerated   Currently in Pain? No/denies   Pain Score 0-No pain            OT Treatments/Exercises (OP) - 04/04/14 1128    Shoulder Exercises: Supine   Protraction PROM;10 reps   Horizontal ABduction PROM;10 reps   External Rotation PROM;10 reps   Internal Rotation PROM;10 reps   Flexion PROM;10 reps   ABduction PROM;10 reps   Other Supine Exercises Bridges 15X   Shoulder Exercises: Seated   Elevation AROM;15 reps   Extension AROM;15 reps   Row AROM;15 reps   Shoulder Exercises: Pulleys   Flexion 1 minute   Shoulder Exercises: Therapy Ball   Flexion 20 reps   Flexion Limitations with verbal guidance to slow rate of repetitions   ABduction 20 reps   ABduction Limitations with min vg to slow rate of repetitions to improve stretch   Shoulder Exercises: Isometric Strengthening   Flexion Supine;3X5"   Extension Supine;3X5"   External Rotation Supine;3X5"   Internal Rotation Supine;3X5"   ABduction Supine;3X5"   ADduction Supine;3X5"   Manual Therapy   Manual Therapy Myofascial release   Myofascial Release Myofascial release,  manual stretching to left upper arm, scapular, trapezius, shoulder, and anterior shoulder region.             OT Short Term Goals - 04/04/14 1146    OT SHORT TERM GOAL #1   Title Pt will be independent in HEP   Status On-going   OT SHORT TERM GOAL #2   Title Pt will imporve LUE PROM to [facility] for improved ability to write checks   Status On-going   OT SHORT TERM GOAL #3   Title Pt will decrease fascial restictions to min-mod in LUE for decreased pain with movement   Status On-going   OT SHORT TERM GOAL #4   Title Pt will decrease pain in LUE during daily tasks to less than 4/10   Status On-going          OT Long Term Goals - 04/04/14 1146    OT LONG TERM GOAL #1   Title Pt will achieve highest level of functioning in all ADL, IADL, and leisure tasks using LUE as dominant   Status On-going   OT LONG TERM GOAL #2   Title Pt will achieve LUE AROM to near [facility] for improved ability to fix her hair using her LUE   Status On-going   OT LONG TERM GOAL #3   Title Pt will improve  strength in LUE to atleast 4/5 for improved abilty to reach into kitchen cabinets   Status On-going   OT LONG TERM GOAL #4   Title Pt will decrease fascial restictions to minimal in LUE for decreased pain overall during daily tasks.   Status On-going   OT LONG TERM GOAL #5   Title Pt will report pain in LUE during daily tasks of less than 2/10.   Status On-going          Plan - 04/04/14 1133    Clinical Impression Statement A:  increased contraction time with isometric strengthening.  less restrictive with PROM into flexion and abduction.  added pulleys for PROM transitioning to [facility].   Plan P:  Complete ball stretches without facilitation from OTR/L.     Problem List Patient Active Problem List   Diagnosis Date Noted  . Proximal humerus fracture 02/28/2014    Shirlean MylarBethany H. Seraphina Mitchner, OTR/L [phone]  04/04/2014, 11:47 AM

## 2014-04-06 ENCOUNTER — Ambulatory Visit (HOSPITAL_COMMUNITY)
Admission: RE | Admit: 2014-04-06 | Discharge: 2014-04-06 | Disposition: A | Discharge status: Home / Self Care | Payer: Medicare Other | Source: Ambulatory Visit | Attending: Family Medicine | Admitting: Family Medicine

## 2014-04-06 DIAGNOSIS — Z5189 Encounter for other specified aftercare: Secondary | ICD-10-CM | POA: Diagnosis not present

## 2014-04-06 DIAGNOSIS — M25512 Pain in left shoulder: Secondary | ICD-10-CM

## 2014-04-06 DIAGNOSIS — R29898 Other symptoms and signs involving the musculoskeletal system: Secondary | ICD-10-CM

## 2014-04-06 DIAGNOSIS — M25612 Stiffness of left shoulder, not elsewhere classified: Secondary | ICD-10-CM

## 2014-04-06 NOTE — Therapy (Signed)
[facility] [address] [place], [place], [address] Phone: [phone]   Fax:  [phone]  Occupational Therapy Treatment  Patient Details  Name: AREESHA DEHAVEN MRN: [id number] Date of Birth: 1945-11-10  Encounter Date: 04/06/2014      OT End of Session - 04/06/14 1136    Visit Number 6   Number of Visits 24   Date for OT Re-Evaluation 04/22/14   Authorization Type AARP Medicare Complete   Authorization Time Period Before 10th visit   Authorization - Visit Number 6   Authorization - Number of Visits 10   OT Start Time 1056   OT Stop Time 1135   OT Time Calculation (min) 39 min   Activity Tolerance Patient tolerated treatment well   Behavior During Therapy [facility] for tasks assessed/performed      Past Medical History  Diagnosis Date  . Hypertension   . Hypercholesteremia   . Dementia     No past surgical history on file.  There were no vitals taken for this visit.  Visit Diagnosis:  Shoulder weakness  Pain in joint, shoulder region, left  Decreased range of motion of shoulder, left      Subjective Assessment - 04/06/14 1056    Symptoms S:  I can sleep all night without it bothering me.  I used to not be able to do that.   Limitations progress as tolerated   Currently in Pain? No/denies   Pain Score 0-No pain          [facility] OT Assessment - 04/06/14 1058    Precautions   Precautions None          OT Treatments/Exercises (OP) - 04/06/14 1058    Exercises   Exercises Shoulder   Shoulder Exercises: Supine   Protraction PROM;10 reps   Horizontal ABduction PROM;10 reps   External Rotation PROM;10 reps   Internal Rotation PROM;10 reps   Flexion PROM;10 reps   ABduction PROM;10 reps   Other Supine Exercises Bridges 15X   Shoulder Exercises: Seated   Elevation AROM;15 reps   Extension AROM;15 reps   Row AROM;15 reps   Shoulder Exercises: Pulleys   Flexion 1 minute   ABduction Limitations attempted abduction  pulleys, however could not complete as it was too painful.   Shoulder Exercises: Therapy Ball   Flexion 20 reps   Flexion Limitations with verbal guidance to slow rate of repetitions   ABduction 20 reps   ABduction Limitations with min vg to slow rate of repetitions to improve stretch   Shoulder Exercises: ROM/Strengthening   Thumb Tacks 1'   Prot/Ret//Elev/Dep 1'   Shoulder Exercises: Isometric Strengthening   Flexion Supine;5X5"   Extension Supine;5X5"   External Rotation Supine;5X5"   Internal Rotation Supine;5X5"   ABduction Supine;5X5"   ADduction Supine;5X5"   Manual Therapy   Manual Therapy Myofascial release   Myofascial Release Myofascial release, manual stretching to left upper arm, scapular, trapezius, shoulder, and posterior shoulder region.             OT Short Term Goals - 04/06/14 1138    OT SHORT TERM GOAL #1   Title Pt will be independent in HEP   Status On-going   OT SHORT TERM GOAL #2   Title Pt will imporve LUE PROM to [facility] for improved ability to write checks   Status On-going   OT SHORT TERM GOAL #3   Title Pt will decrease fascial restictions to min-mod in LUE for decreased pain with  movement   Status On-going   OT SHORT TERM GOAL #4   Title Pt will decrease pain in LUE during daily tasks to less than 4/10   Status On-going          OT Long Term Goals - 04/06/14 1138    OT LONG TERM GOAL #1   Title Pt will achieve highest level of functioning in all ADL, IADL, and leisure tasks using LUE as dominant   Status On-going   OT LONG TERM GOAL #2   Title Pt will achieve LUE AROM to near [facility] for improved ability to fix her hair using her LUE   Status On-going   OT LONG TERM GOAL #3   Title Pt will improve strength in LUE to atleast 4/5 for improved abilty to reach into kitchen cabinets   Status On-going   OT LONG TERM GOAL #4   Title Pt will decrease fascial restictions to minimal in LUE for decreased pain overall during daily tasks.   Status  On-going   OT LONG TERM GOAL #5   Title Pt will report pain in LUE during daily tasks of less than 2/10.   Status On-going          Plan - 04/06/14 1137    Clinical Impression Statement A:  Patient unable to complete posterior shoulder stretch this date during manual therapy due to increased pain.  Attempted abduction AAROM with pulleys however could not complete due to pain.  Added prot/ret//elev/dep and thumbtacks for increased scapular stability.   Plan P:  Complete pulleys into abduction for 30 seconds and add posterior shoulder capsule stretch to HEP.     Problem List Patient Active Problem List   Diagnosis Date Noted  . Proximal humerus fracture 02/28/2014    Vangie Bicker, OTR/L [phone]  04/06/2014, 11:40 AM

## 2014-04-08 ENCOUNTER — Ambulatory Visit (HOSPITAL_COMMUNITY)
Admission: RE | Admit: 2014-04-08 | Discharge: 2014-04-08 | Disposition: A | Discharge status: Home / Self Care | Payer: Medicare Other | Source: Ambulatory Visit | Attending: Family Medicine | Admitting: Family Medicine

## 2014-04-08 ENCOUNTER — Encounter (HOSPITAL_COMMUNITY): Payer: Self-pay

## 2014-04-08 DIAGNOSIS — Z5189 Encounter for other specified aftercare: Secondary | ICD-10-CM | POA: Diagnosis not present

## 2014-04-08 DIAGNOSIS — M25512 Pain in left shoulder: Secondary | ICD-10-CM

## 2014-04-08 DIAGNOSIS — M25612 Stiffness of left shoulder, not elsewhere classified: Secondary | ICD-10-CM

## 2014-04-08 DIAGNOSIS — R29898 Other symptoms and signs involving the musculoskeletal system: Secondary | ICD-10-CM

## 2014-04-08 NOTE — Patient Instructions (Signed)
  Flexibility: Corner Stretch   Standing in corner with hands just above shoulder level and feet _2___ feet from corner, lean forward until a comfortable stretch is felt across chest. Hold _5___ seconds. Repeat _10___ times per set. Do ___1_ sets per session. Do __2__ sessions per day.  http://orth.exer.us/342   Copyright  VHI. All rights reserved.   Scapular Retraction (Standing)   With arms at sides, pinch shoulder blades together. Repeat _10___ times per set. Do _1___ sets per session. Do __2__ sessions per day.  http://orth.exer.us/944   Copyright  VHI. All rights reserved.     Posterior Capsule Stretch   Stand or sit, one arm across body so hand rests over opposite shoulder. Gently push on crossed elbow with other hand until stretch is felt in shoulder of crossed arm. Hold _5__ seconds.  Repeat _10__ times per session. Do __2_ sessions per day.  Copyright  VHI. All rights reserved.   Flexors Stretch, Standing   Stand near wall and slide arm up, with palm facing away from wall, by leaning toward wall. Hold _5__ seconds.  Repeat _10__ times per session. Do __2_ sessions per day.  Copyright  VHI. All rights reserved.

## 2014-04-08 NOTE — Therapy (Signed)
[facility] [address] [place], [place], [address] Phone: [phone]   Fax:  [phone]  Occupational Therapy Treatment  Patient Details  Name: Shawna FlockBarbara C Kirksey MRN: [id number] Date of Birth: 08-02-1945  Encounter Date: 04/08/2014      OT End of Session - 04/08/14 1150    Visit Number 7   Number of Visits 24   Date for OT Re-Evaluation 04/22/14   Authorization Type AARP Medicare Complete   Authorization Time Period Before 10th visit   Authorization - Visit Number 7   Authorization - Number of Visits 10   OT Start Time 1020   OT Stop Time 1100   OT Time Calculation (min) 40 min   Activity Tolerance Patient tolerated treatment well   Behavior During Therapy [facility] for tasks assessed/performed      Past Medical History  Diagnosis Date  . Hypertension   . Hypercholesteremia   . Dementia     No past surgical history on file.  There were no vitals taken for this visit.  Visit Diagnosis:  Shoulder weakness  Pain in joint, shoulder region, left  Decreased range of motion of shoulder, left      Subjective Assessment - 04/08/14 1022    Symptoms S: I what I need to do and I get it done.    Currently in Pain? No/denies          [facility] Assessment - 04/08/14 1041    Precautions   Precautions None               OT Treatments/Exercises (OP) - 04/08/14 1041    Shoulder Exercises: Supine   Protraction PROM;5 reps;AAROM;10 reps   Horizontal ABduction PROM;5 reps;AAROM;10 reps   External Rotation PROM;5 reps;AAROM;10 reps   Internal Rotation PROM;5 reps;AAROM;10 reps   Flexion PROM;5 reps;AAROM;10 reps   ABduction PROM;5 reps;AAROM;10 reps   Shoulder Exercises: Pulleys   Flexion 1 minute   ABduction Limitations 1 minute abduction   Shoulder Exercises: Stretch   Corner Stretch --  10X 5" hold   Manual Therapy   Manual Therapy Myofascial release   Myofascial Release Muscle energy techniqure to anterior  deltoid to to relax tone and improve range of motion                    OT Short Term Goals - 04/08/14 1042    OT SHORT TERM GOAL #1   Title Pt will be independent in HEP   Status On-going   OT SHORT TERM GOAL #2   Title Pt will imporve LUE PROM to [facility] for improved ability to write checks   Status On-going   OT SHORT TERM GOAL #3   Title Pt will decrease fascial restictions to min-mod in LUE for decreased pain with movement   Status On-going   OT SHORT TERM GOAL #4   Title Pt will decrease pain in LUE during daily tasks to less than 4/10   Status On-going           OT Long Term Goals - 04/08/14 1043    OT LONG TERM GOAL #1   Title Pt will achieve highest level of functioning in all ADL, IADL, and leisure tasks using LUE as dominant   Status On-going   OT LONG TERM GOAL #2   Title Pt will achieve LUE AROM to near [facility] for improved ability to fix her hair using her LUE   Status On-going   OT LONG  TERM GOAL #3   Title Pt will improve strength in LUE to atleast 4/5 for improved abilty to reach into kitchen cabinets   Status On-going   OT LONG TERM GOAL #4   Title Pt will decrease fascial restictions to minimal in LUE for decreased pain overall during daily tasks.   Status On-going   OT LONG TERM GOAL #5   Title Pt will report pain in LUE during daily tasks of less than 2/10.   Status On-going               Plan - 04/08/14 1150    Clinical Impression Statement A: Patient completed abduction with pulleys for 1 minute this session. Added shoulder stretches to HEP including posterior capsule stretch, wall stretch, and corner stretch. Patient tolerated well with some pain noted during cross body stretch and corner stretch. Progressed to AAROM supine this date. No pain noted.    Plan P: Add AAROM standing and wall wash.        Problem List Patient Active Problem List   Diagnosis Date Noted  . Proximal humerus fracture 02/28/2014    Limmie PatriciaLaura Kiara Mcdowell,  OTR/L,CBIS  [phone]  04/08/2014, 11:56 AM   [facility] [address] [place], [place], [address] Phone: [phone]   Fax:  [phone]

## 2014-04-11 ENCOUNTER — Ambulatory Visit (HOSPITAL_COMMUNITY)
Admission: RE | Admit: 2014-04-11 | Discharge: 2014-04-11 | Disposition: A | Discharge status: Home / Self Care | Payer: Medicare Other | Source: Ambulatory Visit | Attending: Family Medicine | Admitting: Family Medicine

## 2014-04-11 DIAGNOSIS — M25612 Stiffness of left shoulder, not elsewhere classified: Secondary | ICD-10-CM

## 2014-04-11 DIAGNOSIS — Z5189 Encounter for other specified aftercare: Secondary | ICD-10-CM | POA: Diagnosis not present

## 2014-04-11 DIAGNOSIS — R29898 Other symptoms and signs involving the musculoskeletal system: Secondary | ICD-10-CM

## 2014-04-11 DIAGNOSIS — M25512 Pain in left shoulder: Secondary | ICD-10-CM

## 2014-04-11 NOTE — Therapy (Signed)
[facility] [address] , [place], [address] Phone: [phone]   Fax:  [phone]  Occupational Therapy Treatment  Patient Details  Name: Shawna FlockBarbara C Banks MRN: [id number] Date of Birth: November 02, 1945  Encounter Date: 04/11/2014      OT End of Session - 04/11/14 1151    Visit Number 8   Number of Visits 24   Date for OT Re-Evaluation 04/22/14   Authorization Type AARP Medicare Complete   Authorization Time Period Before 10th visit   Authorization - Visit Number 8   Authorization - Number of Visits 10   OT Start Time 1105   OT Stop Time 1157   OT Time Calculation (min) 52 min   Activity Tolerance Patient tolerated treatment well   Behavior During Therapy [facility] for tasks assessed/performed      Past Medical History  Diagnosis Date  . Hypertension   . Hypercholesteremia   . Dementia     No past surgical history on file.  There were no vitals taken for this visit.  Visit Diagnosis:  Shoulder weakness  Pain in joint, shoulder region, left  Decreased range of motion of shoulder, left      Subjective Assessment - 04/11/14 1108    Symptoms S:  Some days it feels pretty good.  Today it feels good.  It just depends what I do.   Limitations progress as tolerated   Currently in Pain? No/denies   Pain Score 0-No pain          [facility] OT Assessment - 04/11/14 1110    Precautions   Precautions None               OT Treatments/Exercises (OP) - 04/11/14 1111    Shoulder Exercises: Supine   Protraction PROM;10 reps;AAROM;12 reps   Horizontal ABduction PROM;10 reps;AAROM;12 reps   External Rotation PROM;10 reps;AAROM;12 reps   Internal Rotation PROM;10 reps;AAROM;12 reps   Flexion PROM;10 reps;AAROM;12 reps   ABduction PROM;10 reps;AAROM;12 reps   Shoulder Exercises: Seated   Elevation AROM;15 reps   Extension AROM;15 reps   Row AROM;15 reps   Protraction AAROM;10 reps   Protraction Limitations vg and  tactile cues to avoid posterior pelvic tilt and to depress shoulder blade.    Shoulder Exercises: Therapy Ball   Flexion 20 reps   ABduction 20 reps   Shoulder Exercises: ROM/Strengthening   Proximal Shoulder Strengthening, Supine 10 times each without resting   Manual Therapy   Manual Therapy Myofascial release   Myofascial Release Myofascial release, manual stretching to left upper arm, scapular, trapezius, shoulder, and posterior shoulder region                  OT Short Term Goals - 04/11/14 1155    OT SHORT TERM GOAL #1   Title Pt will be independent in HEP   Status On-going   OT SHORT TERM GOAL #2   Title Pt will imporve LUE PROM to [facility] for improved ability to write checks   Status On-going   OT SHORT TERM GOAL #3   Title Pt will decrease fascial restictions to min-mod in LUE for decreased pain with movement   Status On-going   OT SHORT TERM GOAL #4   Title Pt will decrease pain in LUE during daily tasks to less than 4/10   Status On-going           OT Long Term Goals - 04/11/14 1155    OT LONG TERM GOAL #  1   Title Pt will achieve highest level of functioning in all ADL, IADL, and leisure tasks using LUE as dominant   Status On-going   OT LONG TERM GOAL #2   Title Pt will achieve LUE AROM to near [facility] for improved ability to fix her hair using her LUE   Status On-going   OT LONG TERM GOAL #3   Title Pt will improve strength in LUE to atleast 4/5 for improved abilty to reach into kitchen cabinets   Status On-going   OT LONG TERM GOAL #4   Title Pt will decrease fascial restictions to minimal in LUE for decreased pain overall during daily tasks.   Status On-going   OT LONG TERM GOAL #5   Title Pt will report pain in LUE during daily tasks of less than 2/10.   Status On-going               Plan - 04/11/14 1152    Clinical Impression Statement A:  Attempted AAROM in seated, however, patient range too limited and began using compensatory shoulder  hiking.    Plan P:  Attempt wall wash, begin AAROM in seated when supine PROM is 80%.        Problem List Patient Active Problem List   Diagnosis Date Noted  . Proximal humerus fracture 02/28/2014    Shirlean MylarBethany H. Murray, OTR/L [phone]  04/11/2014, 12:03 PM  [facility] [address] [place], [place], [address] Phone: [phone]   Fax:  [phone]

## 2014-04-13 ENCOUNTER — Ambulatory Visit (HOSPITAL_COMMUNITY)
Admission: RE | Admit: 2014-04-13 | Discharge: 2014-04-13 | Disposition: A | Discharge status: Home / Self Care | Payer: Medicare Other | Source: Ambulatory Visit | Attending: Family Medicine | Admitting: Family Medicine

## 2014-04-13 DIAGNOSIS — M25512 Pain in left shoulder: Secondary | ICD-10-CM

## 2014-04-13 DIAGNOSIS — R29898 Other symptoms and signs involving the musculoskeletal system: Secondary | ICD-10-CM

## 2014-04-13 DIAGNOSIS — Z5189 Encounter for other specified aftercare: Secondary | ICD-10-CM | POA: Diagnosis not present

## 2014-04-13 DIAGNOSIS — M25612 Stiffness of left shoulder, not elsewhere classified: Secondary | ICD-10-CM

## 2014-04-13 NOTE — Therapy (Signed)
[facility] [address] [place], [place], [address] Phone: [phone]   Fax:  [phone]  Occupational Therapy Treatment  Patient Details  Name: Shawna Banks MRN: [id number] Date of Birth: [birth year]  Encounter Date: 04/13/2014      OT End of Session - 04/13/14 1146    Visit Number 9   Number of Visits 24   Date for OT Re-Evaluation 04/22/14   Authorization Type AARP Medicare Complete   Authorization Time Period Before 10th visit   Authorization - Visit Number 9   Authorization - Number of Visits 10   OT Start Time 1105   OT Stop Time 1143   OT Time Calculation (min) 38 min   Activity Tolerance Patient tolerated treatment well   Behavior During Therapy [facility] for tasks assessed/performed      Past Medical History  Diagnosis Date  . Hypertension   . Hypercholesteremia   . Dementia     No past surgical history on file.  There were no vitals taken for this visit.  Visit Diagnosis:  Shoulder weakness  Pain in joint, shoulder region, left  Decreased range of motion of shoulder, left      Subjective Assessment - 04/13/14 1105    Symptoms S:  I dont have any pain today.     Limitations progress as tolerated   Currently in Pain? No/denies          [facility] OT Assessment - 04/13/14 0001    Precautions   Precautions None         OT Treatments/Exercises (OP) - 04/13/14 1105    Exercises   Exercises Shoulder   Shoulder Exercises: Supine   Protraction PROM;10 reps;AAROM;12 reps   Horizontal ABduction PROM;10 reps;AAROM;15 reps   External Rotation PROM;10 reps;AAROM;15 reps   Internal Rotation PROM;10 reps;AAROM;15 reps   Flexion PROM;10 reps;AAROM;15 reps   ABduction PROM;10 reps;AAROM;15 reps   Shoulder Exercises: Standing   External Rotation Theraband;10 reps   Theraband Level (Shoulder External Rotation) Level 2 (Red)   Internal Rotation Theraband;10 reps   Theraband Level (Shoulder Internal Rotation)  Level 2 (Red)   Extension Theraband;10 reps   Theraband Level (Shoulder Extension) Level 2 (Red)   Row Theraband;10 reps   Theraband Level (Shoulder Row) Level 2 (Red)   Retraction Theraband;10 reps   Theraband Level (Shoulder Retraction) Level 2 (Red)   Shoulder Exercises: Pulleys   Flexion 2 minutes   ABduction Limitations 30 seconds   Shoulder Exercises: ROM/Strengthening   Wall Wash 1'   Proximal Shoulder Strengthening, Supine 10 times each without resting   Manual Therapy   Manual Therapy Myofascial release          OT Short Term Goals - 04/11/14 1155    OT SHORT TERM GOAL #1   Title Pt will be independent in HEP   Status On-going   OT SHORT TERM GOAL #2   Title Pt will imporve LUE PROM to [facility] for improved ability to write checks   Status On-going   OT SHORT TERM GOAL #3   Title Pt will decrease fascial restictions to min-mod in LUE for decreased pain with movement   Status On-going   OT SHORT TERM GOAL #4   Title Pt will decrease pain in LUE during daily tasks to less than 4/10   Status On-going           OT Long Term Goals - 04/11/14 1155    OT LONG TERM GOAL #1  Title Pt will achieve highest level of functioning in all ADL, IADL, and leisure tasks using LUE as dominant   Status On-going   OT LONG TERM GOAL #2   Title Pt will achieve LUE AROM to near [facility] for improved ability to fix her hair using her LUE   Status On-going   OT LONG TERM GOAL #3   Title Pt will improve strength in LUE to atleast 4/5 for improved abilty to reach into kitchen cabinets   Status On-going   OT LONG TERM GOAL #4   Title Pt will decrease fascial restictions to minimal in LUE for decreased pain overall during daily tasks.   Status On-going   OT LONG TERM GOAL #5   Title Pt will report pain in LUE during daily tasks of less than 2/10.   Status On-going               Plan - 04/13/14 1147    Clinical Impression Statement A:  Able to complete 30 seconds of pulley into  abduction.  Initiated scapular stabilization exercises with theraband and added wall wash.   Plan P:  G code update, improve independence and decrease pain with abduction.         Problem List Patient Active Problem List   Diagnosis Date Noted  . Proximal humerus fracture 02/28/2014    Shirlean MylarBethany H. Dalicia Kisner, OTR/L [phone]  04/13/2014, 11:48 AM  [place] [facility] [address] [place], [place], [address] Phone: [phone]   Fax:  [phone]

## 2014-04-18 ENCOUNTER — Ambulatory Visit (HOSPITAL_COMMUNITY)
Admission: RE | Admit: 2014-04-18 | Discharge: 2014-04-18 | Disposition: A | Discharge status: Home / Self Care | Payer: Medicare Other | Source: Ambulatory Visit | Attending: Family Medicine | Admitting: Family Medicine

## 2014-04-18 DIAGNOSIS — R29898 Other symptoms and signs involving the musculoskeletal system: Secondary | ICD-10-CM

## 2014-04-18 DIAGNOSIS — M25512 Pain in left shoulder: Secondary | ICD-10-CM

## 2014-04-18 DIAGNOSIS — Z5189 Encounter for other specified aftercare: Secondary | ICD-10-CM | POA: Diagnosis not present

## 2014-04-18 DIAGNOSIS — M25612 Stiffness of left shoulder, not elsewhere classified: Secondary | ICD-10-CM

## 2014-04-18 NOTE — Therapy (Signed)
[facility] [address] [place], [place], [address] Phone: [phone]   Fax:  [phone]  Occupational Therapy Treatment  Patient Details  Name: Shawna FlockBarbara C Banks MRN: [id number] Date of Birth: 11/06/1945  Encounter Date: 04/18/2014      OT End of Session - 04/18/14 1121    Visit Number 10   Number of Visits 24   Date for OT Re-Evaluation 04/22/14   Authorization Type A[employer] Medicare Complete   Authorization Time Period before 20th visit   Authorization - Visit Number 10   Authorization - Number of Visits 20   OT Start Time 1018   OT Stop Time 1102   OT Time Calculation (min) 44 min   Activity Tolerance Patient tolerated treatment well   Behavior During Therapy [facility] for tasks assessed/performed      Past Medical History  Diagnosis Date  . Hypertension   . Hypercholesteremia   . Dementia     No past surgical history on file.  There were no vitals taken for this visit.  Visit Diagnosis:  No diagnosis found.      Subjective Assessment - 04/18/14 1050    Special Tests FOTO is 65/100 was 38/100          [facility] - 04/18/14 0001    Precautions   Precautions None               OT Treatments/Exercises (OP) - 04/18/14 1023    Exercises   Exercises Shoulder   Shoulder Exercises: Supine   Protraction PROM;10 reps;AAROM;15 reps   Horizontal ABduction PROM;10 reps;AAROM;15 reps   External Rotation PROM;10 reps;AAROM;15 reps   Internal Rotation PROM;10 reps;AAROM;15 reps   Flexion PROM;10 reps;AAROM;15 reps   ABduction PROM;10 reps;AAROM;15 reps   Shoulder Exercises: Seated   Protraction AAROM;15 reps   Horizontal ABduction AAROM;15 reps   External Rotation AAROM;15 reps   Internal Rotation AAROM;15 reps   Flexion AAROM;10 reps   Abduction AAROM;10 reps   Shoulder Exercises: ROM/Strengthening   Wall Wash 2'   Proximal Shoulder Strengthening, Seated 10 times without resting   Manual Therapy   Manual Therapy Myofascial release   Myofascial Release Myofascial release, manual stretching to left upper arm, scapular, trapezius, shoulder, and posterior shoulder region                  OT Short Term Goals - 04/11/14 1155    OT SHORT TERM GOAL #1   Title Pt will be independent in HEP   Status On-going   OT SHORT TERM GOAL #2   Title Pt will imporve LUE PROM to [facility] for improved ability to write checks   Status On-going   OT SHORT TERM GOAL #3   Title Pt will decrease fascial restictions to min-mod in LUE for decreased pain with movement   Status On-going   OT SHORT TERM GOAL #4   Title Pt will decrease pain in LUE during daily tasks to less than 4/10   Status On-going           OT Long Term Goals - 04/11/14 1155    OT LONG TERM GOAL #1   Title Pt will achieve highest level of functioning in all ADL, IADL, and leisure tasks using LUE as dominant   Status On-going   OT LONG TERM GOAL #2   Title Pt will achieve LUE AROM to [facility] for improved ability to fix her hair using her LUE   Status On-going  OT LONG TERM GOAL #3   Title Pt will improve strength in LUE to atleast 4/5 for improved abilty to reach into kitchen cabinets   Status On-going   OT LONG TERM GOAL #4   Title Pt will decrease fascial restictions to minimal in LUE for decreased pain overall during daily tasks.   Status On-going   OT LONG TERM GOAL #5   Title Pt will report pain in LUE during daily tasks of less than 2/10.   Status On-going               Plan - 04/18/14 1122    Clinical Impression Statement A:  Able to complete AAROM in seated for first time this visit.  added proximal shoulder strengthening in seated this date.    Plan P:  reassess end of week, attempt AROM in supine.          G-Codes - 04/18/14 1051    Functional Limitation Self care   Self Care Current Status [phone](G8987) At least 20 percent but less than 40 percent impaired, limited or restricted   Self Care Goal  Status ([id number](G8988) At least 20 percent but less than 40 percent impaired, limited or restricted      Problem List Patient Active Problem List   Diagnosis Date Noted  . Proximal humerus fracture 02/28/2014    Shirlean MylarBethany H. Chun Sellen, OTR/L [phone]  04/18/2014, 11:24 AM  [place] [facility] [address] [place], [place], [address] Phone: [phone]   Fax:  [phone]

## 2014-04-20 ENCOUNTER — Ambulatory Visit (HOSPITAL_COMMUNITY)
Admission: RE | Admit: 2014-04-20 | Discharge: 2014-04-20 | Disposition: A | Discharge status: Home / Self Care | Payer: Medicare Other | Source: Ambulatory Visit | Attending: Orthopedic Surgery | Admitting: Orthopedic Surgery

## 2014-04-20 DIAGNOSIS — Z5189 Encounter for other specified aftercare: Secondary | ICD-10-CM | POA: Diagnosis not present

## 2014-04-20 DIAGNOSIS — M25512 Pain in left shoulder: Secondary | ICD-10-CM

## 2014-04-20 DIAGNOSIS — M25612 Stiffness of left shoulder, not elsewhere classified: Secondary | ICD-10-CM

## 2014-04-20 DIAGNOSIS — R29898 Other symptoms and signs involving the musculoskeletal system: Secondary | ICD-10-CM

## 2014-04-20 NOTE — Patient Instructions (Signed)
Complete 10 times each 2 times per day.  Complete in seated or lying down position.  Call Shawna Banks [phone] with any questions.   ROM: Flexion - Wand   Bring wand directly over head, leading with right side. Reach back until stretch is felt. Hold ____ seconds. Repeat ____ times per set. Do ____ sets per session. Do ____ sessions per day.  http://orth.exer.us/744   Copyright  VHI. All rights reserved.   ROM: Abduction - Wand   Holding wand with left hand palm up, push wand directly out to side, leading with other hand palm down, until stretch is felt. Hold ____ seconds. Repeat ____ times per set. Do ____ sets per session. Do ____ sessions per day.  http://orth.exer.us/746   Copyright  VHI. All rights reserved.   ROM: Horizontal Abduction / Adduction - Wand   Keeping both palms down, push right hand across body with other hand. Then pull back across body, keeping arms parallel to floor. Do not allow trunk to twist. Hold ____ seconds. Repeat ____ times per set. Do ____ sets per session. Do ____ sessions per day.  http://orth.exer.us/752   Copyright  VHI. All rights reserved.   ROM: External / Internal Rotation - Wand   Holding wand with left hand palm up, push out from body with other hand, palm down. Keep both elbows bent. When stretch is felt, hold ____ seconds. Repeat to other side, leading with same hand. Keep elbows bent. Repeat ____ times per set. Do ____ sets per session. Do ____ sessions per day.  http://orth.exer.us/748   Copyright  VHI. All rights reserved.

## 2014-04-20 NOTE — Therapy (Signed)
[facility] [address] , [place], [address] Phone: [phone]   Fax:  [phone]  Occupational Therapy Treatment and Reassessment  Patient Details  Name: Shawna Banks MRN: [id number] Date of Birth: May 19, 1945  Encounter Date: 04/20/2014      OT End of Session - 04/20/14 1008    Visit Number 11   Number of Visits 24   Date for OT Re-Evaluation 05/18/14   Authorization Type AARP Medicare Complete   Authorization Time Period before 20th visit   Authorization - Visit Number 11   Authorization - Number of Visits 20   OT Start Time 0935   Activity Tolerance Patient tolerated treatment well   Behavior During Therapy [facility] for tasks assessed/performed      Past Medical History  Diagnosis Date  . Hypertension   . Hypercholesteremia   . Dementia     No past surgical history on file.  There were no vitals taken for this visit.  Visit Diagnosis:  Shoulder weakness  Pain in joint, shoulder region, left  Decreased range of motion of shoulder, left      Subjective Assessment - 04/20/14 0937    Symptoms S:  Ive been doing my exercises at home.    Limitations progress as tolerated   Currently in Pain? No/denies   Pain Score 0-No pain          [facility] OT Assessment - 04/20/14 0952    Precautions   Precautions None   ADL   ADL comments patient is able to use left arm actively with most activities.  She is having difficulty with reaching into overhead cabinets and reach behind her back with her left arm    Palpation   Palpation moderate fascial restrictions in upper left arm    AROM   Overall AROM Comments assessed in supine   Left Shoulder Flexion 120 Degrees  seated flexion 95   Left Shoulder ABduction 100 Degrees  seated abduction 65   Left Shoulder Internal Rotation 90 Degrees   Left Shoulder External Rotation 55 Degrees   PROM   Overall PROM Comments assessed in supine (03/25/14)   Left Shoulder Flexion  132 Degrees  15   Left Shoulder ABduction 110 Degrees  45   Left Shoulder Internal Rotation 90 Degrees  93   Left Shoulder External Rotation 65 Degrees  19   Strength   Overall Strength Comments assesed in seated   Left Shoulder Flexion 3+/5   Left Shoulder Extension 3+/5   Left Shoulder ABduction 3+/5   Left Shoulder Internal Rotation 3+/5   Left Shoulder External Rotation 3+/5               OT Treatments/Exercises (OP) - 04/20/14 1003    Shoulder Exercises: Supine   Protraction PROM;5 reps   Horizontal ABduction PROM;5 reps   External Rotation PROM;5 reps   Internal Rotation PROM;5 reps   Flexion PROM;5 reps   ABduction PROM;5 reps   Shoulder Exercises: Seated   Protraction AAROM;15 reps   Horizontal ABduction AAROM;15 reps   External Rotation AAROM;15 reps   Internal Rotation AAROM;15 reps   Flexion AAROM;10 reps   Abduction AAROM;10 reps   Manual Therapy   Manual Therapy Myofascial release   Myofascial Release Myofascial release, manual stretching to left upper arm, scapular, trapezius, shoulder, and posterior shoulder region      ball circles right and left 5 times each          OT  Education - 04/20/14 1008    Education provided Yes   Education Details AAROM dowel exercises   Person(s) Educated Patient   Methods Explanation;Demonstration;Handout   Comprehension Verbalized understanding;Returned demonstration          OT Short Term Goals - 04/20/14 1033    OT SHORT TERM GOAL #1   Title Pt will be independent in HEP   Status Achieved   OT SHORT TERM GOAL #2   Title Pt will imporve LUE PROM to [facility] for improved ability to write checks   Status Achieved   OT SHORT TERM GOAL #3   Title Pt will decrease fascial restictions to min-mod in LUE for decreased pain with movement   Status Achieved   OT SHORT TERM GOAL #4   Title Pt will decrease pain in LUE during daily tasks to less than 4/10   Status Achieved           OT Long Term Goals -  04/20/14 1034    OT LONG TERM GOAL #1   Title Pt will achieve highest level of functioning in all ADL, IADL, and leisure tasks using LUE as dominant   Status On-going   OT LONG TERM GOAL #2   Title Pt will achieve LUE AROM to near [facility] for improved ability to fix her hair using her LUE   Status On-going   OT LONG TERM GOAL #3   Title Pt will improve strength in LUE to atleast 4/5 for improved abilty to reach into kitchen cabinets   Status On-going   OT LONG TERM GOAL #4   Title Pt will decrease fascial restictions to minimal in LUE for decreased pain overall during daily tasks.   Status On-going   OT LONG TERM GOAL #5   Title Pt will report pain in LUE during daily tasks of less than 2/10.   Status On-going               Plan - 04/20/14 1009    Clinical Impression Statement A:  Patient has made significant gains in AROM, PROM, and strength.  She is able to complete most daily activities using her left arm comfortably to shoulder height.  Reaching overhead and lifting items continues to be difficult due to continuing defiicits in AROM and strength.   Rehab Potential Good   OT Frequency 3x / week   OT Duration 4 weeks   Plan P:  Continue skilled OT intervention 3 times a week for 4 weeks to improve AROM and strength to [facility] in order to return to prior level of function using left arm as dominant.     Consulted and Agree with Plan of Care Patient        Problem List Patient Active Problem List   Diagnosis Date Noted  . Proximal humerus fracture 02/28/2014    Sondra BargesMurray, Bethany TurnerHelene 04/20/2014, 10:41 AM  [facility] [address] [place], [place], [address] Phone: [phone]   Fax:  [phone]

## 2014-04-25 ENCOUNTER — Ambulatory Visit (HOSPITAL_COMMUNITY)
Admission: RE | Admit: 2014-04-25 | Discharge: 2014-04-25 | Disposition: A | Discharge status: Home / Self Care | Payer: Medicare Other | Source: Ambulatory Visit | Attending: Family Medicine | Admitting: Family Medicine

## 2014-04-25 ENCOUNTER — Encounter (HOSPITAL_COMMUNITY): Payer: Self-pay

## 2014-04-25 DIAGNOSIS — M6281 Muscle weakness (generalized): Secondary | ICD-10-CM | POA: Insufficient documentation

## 2014-04-25 DIAGNOSIS — M25512 Pain in left shoulder: Secondary | ICD-10-CM | POA: Insufficient documentation

## 2014-04-25 DIAGNOSIS — Z5189 Encounter for other specified aftercare: Secondary | ICD-10-CM | POA: Diagnosis not present

## 2014-04-25 DIAGNOSIS — M25612 Stiffness of left shoulder, not elsewhere classified: Secondary | ICD-10-CM | POA: Diagnosis not present

## 2014-04-25 DIAGNOSIS — R29898 Other symptoms and signs involving the musculoskeletal system: Secondary | ICD-10-CM

## 2014-04-25 NOTE — Therapy (Signed)
[facility] [address] [place], [place], [address] Phone: [phone]   Fax:  [phone]  Occupational Therapy Treatment  Patient Details  Name: Shawna Banks MRN: [id number] Date of Birth: [birth year]  Encounter Date: 04/25/2014      OT End of Session - 04/25/14 1147    Visit Number 12   Number of Visits 24   Date for OT Re-Evaluation 05/18/14   Authorization Type AARP Medicare Complete   Authorization Time Period before 20th visit   Authorization - Visit Number 12   Authorization - Number of Visits 20   OT Start Time 1100   OT Stop Time 1145   OT Time Calculation (min) 45 min   Activity Tolerance Patient tolerated treatment well   Behavior During Therapy [facility] for tasks assessed/performed      Past Medical History  Diagnosis Date  . Hypertension   . Hypercholesteremia   . Dementia     No past surgical history on file.  There were no vitals taken for this visit.  Visit Diagnosis:  Pain in joint, shoulder region, left  Shoulder weakness  Decreased range of motion of shoulder, left      Subjective Assessment - 04/25/14 1107    Symptoms S: I try to work my arm a lot when I'm just sitting watching TV I'll rub it.    Currently in Pain? No/denies          [facility] OT Assessment - 04/25/14 1108    Precautions   Precautions None               OT Treatments/Exercises (OP) - 04/25/14 1109    Shoulder Exercises: Supine   Protraction PROM;5 reps;AROM;12 reps   Horizontal ABduction PROM;5 reps;AROM;12 reps   External Rotation PROM;5 reps;AROM;12 reps   Internal Rotation PROM;5 reps;AROM;12 reps   Flexion PROM;5 reps;AROM;12 reps   ABduction PROM;5 reps;AROM;12 reps   Shoulder Exercises: Standing   Protraction AROM;12 reps   Horizontal ABduction AROM;12 reps   External Rotation AROM;12 reps   Internal Rotation AROM;12 reps   Flexion AROM;12 reps   ABduction AROM;12 reps   Extension Theraband;12 reps   Theraband Level (Shoulder Extension) Level 2 (Red)   Row Theraband;12 reps   Theraband Level (Shoulder Row) Level 2 (Red)   Retraction Theraband;12 reps   Theraband Level (Shoulder Retraction) Level 2 (Red)   Other Standing Exercises Abduction; 12X AROM   Shoulder Exercises: Therapy Ball   Right/Left 5 reps   Shoulder Exercises: ROM/Strengthening   UBE (Upper Arm Bike) Level 1 3' forward 3' reverse   Wall Wash 2' while doing the alphabet on the wall   X to V Arms 12X   Proximal Shoulder Strengthening, Supine 12X with no rest breaks.    Proximal Shoulder Strengthening, Seated 12X with rest breaks after each position.   Manual Therapy   Manual Therapy Myofascial release   Myofascial Release Myofascial release, manual stretching to left upper arm, scapular, trapezius, shoulder, and posterior shoulder region                   OT Short Term Goals - 04/25/14 1108    OT SHORT TERM GOAL #1   Title Pt will be independent in HEP   OT SHORT TERM GOAL #2   Title Pt will imporve LUE PROM to [facility] for improved ability to write checks   OT SHORT TERM GOAL #3   Title Pt will decrease fascial restictions to  min-mod in LUE for decreased pain with movement   OT SHORT TERM GOAL #4   Title Pt will decrease pain in LUE during daily tasks to less than 4/10           OT Long Term Goals - 04/25/14 1108    OT LONG TERM GOAL #1   Title Pt will achieve highest level of functioning in all ADL, IADL, and leisure tasks using LUE as dominant   Status On-going   OT LONG TERM GOAL #2   Title Pt will achieve LUE AROM to near [facility] for improved ability to fix her hair using her LUE   Status On-going   OT LONG TERM GOAL #3   Title Pt will improve strength in LUE to atleast 4/5 for improved abilty to reach into kitchen cabinets   Status On-going   OT LONG TERM GOAL #4   Title Pt will decrease fascial restictions to minimal in LUE for decreased pain overall during daily tasks.   Status On-going   OT  LONG TERM GOAL #5   Title Pt will report pain in LUE during daily tasks of less than 2/10.   Status On-going               Plan - 04/25/14 1147    Clinical Impression Statement A: Added AROM supine and standing X to V arms, and UBE bike. Patient tolerated well.    Plan P: Add Ball on the wall.         Problem List Patient Active Problem List   Diagnosis Date Noted  . Proximal humerus fracture 02/28/2014    Limmie Patricia, OTR/L,CBIS  [phone]  04/25/2014, 11:48 AM  [facility] [address] [place], [place], [address] Phone: [phone]   Fax:  [phone]

## 2014-04-27 ENCOUNTER — Ambulatory Visit (HOSPITAL_COMMUNITY)
Admission: RE | Admit: 2014-04-27 | Discharge: 2014-04-27 | Disposition: A | Discharge status: Home / Self Care | Payer: Medicare Other | Source: Ambulatory Visit | Attending: Family Medicine | Admitting: Family Medicine

## 2014-04-27 ENCOUNTER — Encounter (HOSPITAL_COMMUNITY): Payer: Self-pay

## 2014-04-27 DIAGNOSIS — M25612 Stiffness of left shoulder, not elsewhere classified: Secondary | ICD-10-CM

## 2014-04-27 DIAGNOSIS — Z5189 Encounter for other specified aftercare: Secondary | ICD-10-CM | POA: Diagnosis not present

## 2014-04-27 DIAGNOSIS — R29898 Other symptoms and signs involving the musculoskeletal system: Secondary | ICD-10-CM

## 2014-04-27 DIAGNOSIS — M25512 Pain in left shoulder: Secondary | ICD-10-CM

## 2014-04-27 NOTE — Therapy (Signed)
[place] [address] [place], [place], [address] Phone: [phone]   Fax:  [phone]  Occupational Therapy Treatment  Patient Details  Name: Shawna Banks MRN: [id number] Date of Birth: 1946/02/26  Encounter Date: 04/27/2014      OT End of Session - 04/27/14 1053    Visit Number 13   Number of Visits 24   Date for OT Re-Evaluation 05/18/14   Authorization Type AARP Medicare Complete   Authorization Time Period before 20th visit   Authorization - Visit Number 13   Authorization - Number of Visits 20   OT Start Time 1017   OT Stop Time 1059   OT Time Calculation (min) 42 min   Activity Tolerance Patient tolerated treatment well   Behavior During Therapy [facility] for tasks assessed/performed      Past Medical History  Diagnosis Date  . Hypertension   . Hypercholesteremia   . Dementia     No past surgical history on file.  There were no vitals taken for this visit.  Visit Diagnosis:  Pain in joint, shoulder region, left  Shoulder weakness  Decreased range of motion of shoulder, left      Subjective Assessment - 04/27/14 1016    Symptoms "Sometimes pretty good, sometimes its a little sore."   Limitations progress as tolerated   Currently in Pain? No/denies          [facility] OT Assessment - 04/27/14 0001    Precautions   Precautions None               OT Treatments/Exercises (OP) - 04/27/14 1018    Shoulder Exercises: Supine   Protraction PROM;5 reps;AROM;15 reps   Horizontal ABduction PROM;5 reps;AROM;15 reps   External Rotation PROM;5 reps;AROM;15 reps   Internal Rotation PROM;5 reps;AROM;15 reps   Flexion PROM;5 reps;AROM;15 reps   ABduction PROM;5 reps;AROM;15 reps   Shoulder Exercises: Standing   Protraction AROM;12 reps   Horizontal ABduction AROM;12 reps   External Rotation AROM;12 reps   Internal Rotation AROM;12 reps   Flexion AROM;12 reps   ABduction AROM;12 reps   Extension  Theraband;15 reps   Theraband Level (Shoulder Extension) Level 2 (Red)   Row Theraband;15 reps   Theraband Level (Shoulder Row) Level 2 (Red)   Retraction Theraband;15 reps   Theraband Level (Shoulder Retraction) Level 2 (Red)   Shoulder Exercises: Therapy Ball   Right/Left 5 reps   Shoulder Exercises: ROM/Strengthening   UBE (Upper Arm Bike) Level 1 3' forward 3' reverse   Proximal Shoulder Strengthening, Supine 15x with no rest breaks   Proximal Shoulder Strengthening, Seated 12x with no rest breaks   Ball on Wall 1 min with weighted green ball   Manual Therapy   Manual Therapy Myofascial release   Myofascial Release Myofascial release, manual stretching to left upper arm, scapular, trapezius, shoulder, and posterior shoulder region.                    OT Short Term Goals - 04/25/14 1108    OT SHORT TERM GOAL #1   Title Pt will be independent in HEP   OT SHORT TERM GOAL #2   Title Pt will imporve LUE PROM to [facility] for improved ability to write checks   OT SHORT TERM GOAL #3   Title Pt will decrease fascial restictions to min-mod in LUE for decreased pain with movement   OT SHORT TERM GOAL #4   Title Pt will decrease pain  in LUE during daily tasks to less than 4/10           OT Long Term Goals - 04/25/14 1108    OT LONG TERM GOAL #1   Title Pt will achieve highest level of functioning in all ADL, IADL, and leisure tasks using LUE as dominant   Status On-going   OT LONG TERM GOAL #2   Title Pt will achieve LUE AROM to near [facility] for improved ability to fix her hair using her LUE   Status On-going   OT LONG TERM GOAL #3   Title Pt will improve strength in LUE to atleast 4/5 for improved abilty to reach into kitchen cabinets   Status On-going   OT LONG TERM GOAL #4   Title Pt will decrease fascial restictions to minimal in LUE for decreased pain overall during daily tasks.   Status On-going   OT LONG TERM GOAL #5   Title Pt will report pain in LUE during daily  tasks of less than 2/10.   Status On-going               Plan - 04/27/14 1054    Clinical Impression Statement Increased supine AROM reps to 15 with good tolerance.  Pt indicates she has been completing HEP at home mulitple times per day.  Added ball on wall with good toelrance - pt did not vebalize any fatigue.  Increased theraband reps with good tolerance.  continued R/L ball and UBE with good tolerance.  Pt verbalized minimal fatigue at end of session.     Plan Add overhead lacing.        Problem List Patient Active Problem List   Diagnosis Date Noted  . Proximal humerus fracture 02/28/2014    Marry GuanMarie Rawlings Rogena Deupree, MS, OTR/L [facility] [phone] 04/27/2014, 10:56 AM  [place] [facility] [address] [place], [place], [address] Phone: [phone]   Fax:  [phone]

## 2014-04-29 ENCOUNTER — Encounter (HOSPITAL_COMMUNITY): Payer: Self-pay

## 2014-04-29 ENCOUNTER — Ambulatory Visit (HOSPITAL_COMMUNITY)
Admission: RE | Admit: 2014-04-29 | Discharge: 2014-04-29 | Disposition: A | Discharge status: Home / Self Care | Payer: Medicare Other | Source: Ambulatory Visit | Attending: Family Medicine | Admitting: Family Medicine

## 2014-04-29 DIAGNOSIS — M25512 Pain in left shoulder: Secondary | ICD-10-CM

## 2014-04-29 DIAGNOSIS — R29898 Other symptoms and signs involving the musculoskeletal system: Secondary | ICD-10-CM

## 2014-04-29 DIAGNOSIS — M25612 Stiffness of left shoulder, not elsewhere classified: Secondary | ICD-10-CM

## 2014-04-29 DIAGNOSIS — Z5189 Encounter for other specified aftercare: Secondary | ICD-10-CM | POA: Diagnosis not present

## 2014-04-29 NOTE — Therapy (Signed)
[facility] [address] [place], [place], [address] Phone: [phone]   Fax:  [phone]  Occupational Therapy Treatment  Patient Details  Name: Shawna Banks MRN: [id number] Date of Birth: Aug 10, 1945 Referring Provider:  Milana ObeyKnowlton, Stephen D, MD  Encounter Date: 04/29/2014      OT End of Session - 04/29/14 1151    Visit Number 14   Number of Visits 24   Date for OT Re-Evaluation 05/18/14   Authorization Type A[employer] Medicare Complete   Authorization Time Period before 20th visit   Authorization - Visit Number 14   Authorization - Number of Visits 20   OT Start Time 1024   OT Stop Time 1102   OT Time Calculation (min) 38 min   Activity Tolerance Patient tolerated treatment well   Behavior During Therapy [facility] for tasks assessed/performed      Past Medical History  Diagnosis Date  . Hypertension   . Hypercholesteremia   . Dementia     No past surgical history on file.  There were no vitals taken for this visit.  Visit Diagnosis:  Pain in joint, shoulder region, left  Shoulder weakness  Decreased range of motion of shoulder, left      Subjective Assessment - 04/29/14 1025    Symptoms "its doing pretty good today. I did a whole lot of housework yesterday."   Limitations progress as tolerated   Currently in Pain? No/denies          [facility] Assessment - 04/29/14 0001    Precautions   Precautions None               OT Treatments/Exercises (OP) - 04/29/14 1025    Shoulder Exercises: Supine   Protraction PROM;5 reps;AROM;15 reps   Horizontal ABduction PROM;5 reps;AROM;15 reps   External Rotation PROM;5 reps;AROM;15 reps   Internal Rotation PROM;5 reps;AROM;15 reps   Flexion PROM;5 reps;AROM;15 reps   ABduction PROM;5 reps;AROM;15 reps   Shoulder Exercises: Standing   Protraction AROM;15 reps   Horizontal ABduction AROM;15 reps   External Rotation AROM;15 reps   Internal Rotation AROM;15 reps   Flexion AROM;15 reps   ABduction AROM;15 reps   Shoulder Exercises: ROM/Strengthening   Over Head Lace 2 min   X to V Arms 15x  one rest break at #8   Proximal Shoulder Strengthening, Supine 15x with no rest breaks   Proximal Shoulder Strengthening, Seated 12x with no rest breaks   Ball on Wall 1 min with weighted green ball   Manual Therapy   Manual Therapy Myofascial release   Myofascial Release Myofascial release, manual stretching to left upper arm, scapular, trapezius, shoulder, and posterior shoulder region.                    OT Short Term Goals - 04/25/14 1108    OT SHORT TERM GOAL #1   Title Pt will be independent in HEP   OT SHORT TERM GOAL #2   Title Pt will imporve LUE [facility]OM to [facility] for improved ability to write checks   OT SHORT TERM GOAL #3   Title Pt will decrease fascial restictions to min-mod in LUE for decreased pain with movement   OT SHORT TERM GOAL #4   Title Pt will decrease pain in LUE during daily tasks to less than 4/10           OT Long Term Goals - 04/25/14 1108    OT LONG TERM GOAL #1  Title Pt will achieve highest level of functioning in all ADL, IADL, and leisure tasks using LUE as dominant   Status On-going   OT LONG TERM GOAL #2   Title Pt will achieve LUE AROM to near [facility] for improved ability to fix her hair using her LUE   Status On-going   OT LONG TERM GOAL #3   Title Pt will improve strength in LUE to atleast 4/5 for improved abilty to reach into kitchen cabinets   Status On-going   OT LONG TERM GOAL #4   Title Pt will decrease fascial restictions to minimal in LUE for decreased pain overall during daily tasks.   Status On-going   OT LONG TERM GOAL #5   Title Pt will report pain in LUE during daily tasks of less than 2/10.   Status On-going               Plan - 04/29/14 1152    Clinical Impression Statement Continued supine AROM at 15 reps this session - pt tolerated well. Increased standing AROM reps to 15 and pt  tolerated well with some fatigue. Added overhead lacing and pt demonstrated good reaching skils with minimal fatigue.     Plan Add 1# to supine exercises.        Problem List Patient Active Problem List   Diagnosis Date Noted  . Proximal humerus fracture 02/28/2014    Marry Guan Iasha Mccalister, MS, OTR/L [facility] Rehabilitation [phone] 04/29/2014, 11:56 AM   [place] [facility] [place] [address] [place], [place], [address] Phone: [phone]   Fax:  [phone]

## 2014-05-02 ENCOUNTER — Ambulatory Visit (HOSPITAL_COMMUNITY)
Admission: RE | Admit: 2014-05-02 | Discharge: 2014-05-02 | Disposition: A | Discharge status: Home / Self Care | Payer: Medicare Other | Source: Ambulatory Visit | Attending: Family Medicine | Admitting: Family Medicine

## 2014-05-02 ENCOUNTER — Encounter (HOSPITAL_COMMUNITY): Payer: Self-pay

## 2014-05-02 DIAGNOSIS — Z5189 Encounter for other specified aftercare: Secondary | ICD-10-CM | POA: Diagnosis not present

## 2014-05-02 DIAGNOSIS — R29898 Other symptoms and signs involving the musculoskeletal system: Secondary | ICD-10-CM

## 2014-05-02 DIAGNOSIS — M25612 Stiffness of left shoulder, not elsewhere classified: Secondary | ICD-10-CM

## 2014-05-02 DIAGNOSIS — M25512 Pain in left shoulder: Secondary | ICD-10-CM

## 2014-05-02 NOTE — Therapy (Signed)
[facility] [address] [place], [place], [address] Phone: [phone]   Fax:  [phone]  Occupational Therapy Treatment  Patient Details  Name: Shawna Banks MRN: [id number] Date of Birth: March 18, 1946 Referring Provider:  Milana Obey, MD  Encounter Date: 05/02/2014      OT End of Session - 05/02/14 1150    Visit Number 15   Number of Visits 24   Date for OT Re-Evaluation 05/18/14   Authorization Type A[employer] Medicare Complete   Authorization Time Period before 20th visit   Authorization - Visit Number 15   Authorization - Number of Visits 20   OT Start Time 1111   OT Stop Time 1152   OT Time Calculation (min) 41 min   Activity Tolerance Patient tolerated treatment well   Behavior During Therapy [facility] for tasks assessed/performed      Past Medical History  Diagnosis Date  . Hypertension   . Hypercholesteremia   . Dementia     No past surgical history on file.  There were no vitals taken for this visit.  Visit Diagnosis:  Pain in joint, shoulder region, left  Shoulder weakness  Decreased range of motion of shoulder, left      Subjective Assessment - 05/02/14 1124    Symptoms S: My arm feels really good lately.   Currently in Pain? No/denies          [facility] OT Assessment - 05/02/14 1124    Precautions   Precautions None               OT Treatments/Exercises (OP) - 05/02/14 1124    Shoulder Exercises: Supine   Protraction PROM;5 reps;Strengthening;10 reps;Weights   Protraction Weight (lbs) 1   Horizontal ABduction PROM;5 reps;Strengthening;10 reps;Weights   Horizontal ABduction Weight (lbs) 1   External Rotation PROM;5 reps;Strengthening;10 reps   Internal Rotation Weights   Internal Rotation Weight (lbs) 1   Flexion PROM;5 reps;Strengthening;10 reps;Weights   Shoulder Flexion Weight (lbs) 1   ABduction PROM;5 reps;AROM;10 reps;Weights   Shoulder ABduction Weight (lbs) 1   Shoulder  Exercises: Standing   Protraction AROM;20 reps   Horizontal ABduction AROM;20 reps   External Rotation AROM;20 reps   Internal Rotation AROM;20 reps   Flexion AROM;20 reps   ABduction AROM;20 reps   Shoulder Exercises: ROM/Strengthening   Over Head Lace 2 min   X to V Arms 15x   Proximal Shoulder Strengthening, Supine 10X with 1#. no rest breaks.    Proximal Shoulder Strengthening, Seated 15X with no rest breaks   Ball on Wall 1; flexion 1' abduction green   Manual Therapy   Manual Therapy Myofascial release   Myofascial Release Muscle energy technique to left anterior deltoid to relax tone and muscle spasm and improve range of motion.                  OT Short Term Goals - 04/25/14 1108    OT SHORT TERM GOAL #1   Title Pt will be independent in HEP   OT SHORT TERM GOAL #2   Title Pt will imporve LUE PROM to [facility] for improved ability to write checks   OT SHORT TERM GOAL #3   Title Pt will decrease fascial restictions to min-mod in LUE for decreased pain with movement   OT SHORT TERM GOAL #4   Title Pt will decrease pain in LUE during daily tasks to less than 4/10  OT Long Term Goals - 04/25/14 1108    OT LONG TERM GOAL #1   Title Pt will achieve highest level of functioning in all ADL, IADL, and leisure tasks using LUE as dominant   Status On-going   OT LONG TERM GOAL #2   Title Pt will achieve LUE AROM to near [facility] for improved ability to fix her hair using her LUE   Status On-going   OT LONG TERM GOAL #3   Title Pt will improve strength in LUE to atleast 4/5 for improved abilty to reach into kitchen cabinets   Status On-going   OT LONG TERM GOAL #4   Title Pt will decrease fascial restictions to minimal in LUE for decreased pain overall during daily tasks.   Status On-going   OT LONG TERM GOAL #5   Title Pt will report pain in LUE during daily tasks of less than 2/10.   Status On-going               Plan - 05/02/14 1150    Clinical  Impression Statement A: Added 1# handweight this session. patient tolerated well. slight pain with supine abduction. Pt required min vc's for correct form.   Plan P: Add 1# to standing exercises.         Problem List Patient Active Problem List   Diagnosis Date Noted  . Proximal humerus fracture 02/28/2014     Limmie PatriciaLaura Geri Hepler, OTR/L,CBIS  [phone]  05/02/2014, 11:57 AM  [facility] [facility] [address] [place], [place], [address] Phone: [phone]   Fax:  [phone]

## 2014-05-03 ENCOUNTER — Encounter: Payer: Self-pay | Admitting: Orthopedic Surgery

## 2014-05-03 ENCOUNTER — Ambulatory Visit (INDEPENDENT_AMBULATORY_CARE_PROVIDER_SITE_OTHER): Payer: Self-pay | Admitting: Orthopedic Surgery

## 2014-05-03 VITALS — BP 144/97 | Ht 64.0 in | Wt 125.0 lb

## 2014-05-03 DIAGNOSIS — S42202D Unspecified fracture of upper end of left humerus, subsequent encounter for fracture with routine healing: Secondary | ICD-10-CM

## 2014-05-03 NOTE — Progress Notes (Signed)
Patient ID: Shawna FlockBarbara C Banks, female   DOB: Dec 24, 1945, 69 y.o.   MRN: [id number] Chief Complaint  Patient presents with  . Follow-up    6 week recheck Left humerus s/p therapy, DOI 02/21/14    Treated for left proximal humerus fracture with shoulder mobilization followed by occupational therapy. She's doing well minimal discomfort some stiffness recommend continued physical therapy follow-up with us as needed

## 2014-05-04 ENCOUNTER — Encounter (HOSPITAL_COMMUNITY): Payer: Self-pay

## 2014-05-04 ENCOUNTER — Ambulatory Visit (HOSPITAL_COMMUNITY)
Admission: RE | Admit: 2014-05-04 | Discharge: 2014-05-04 | Disposition: A | Discharge status: Home / Self Care | Payer: Medicare Other | Source: Ambulatory Visit | Attending: Family Medicine | Admitting: Family Medicine

## 2014-05-04 DIAGNOSIS — R29898 Other symptoms and signs involving the musculoskeletal system: Secondary | ICD-10-CM

## 2014-05-04 DIAGNOSIS — Z5189 Encounter for other specified aftercare: Secondary | ICD-10-CM | POA: Diagnosis not present

## 2014-05-04 DIAGNOSIS — M25512 Pain in left shoulder: Secondary | ICD-10-CM

## 2014-05-04 DIAGNOSIS — M25612 Stiffness of left shoulder, not elsewhere classified: Secondary | ICD-10-CM

## 2014-05-04 NOTE — Therapy (Signed)
[facility] [address] [place], [place], [address] Phone: [phone]   Fax:  [phone]  Occupational Therapy Treatment  Patient Details  Name: Shawna FlockBarbara C Banks MRN: [id number] Date of Birth: 1946-04-07 Referring Provider:  Milana ObeyKnowlton, Stephen D, MD  Encounter Date: 05/04/2014      OT End of Session - 05/04/14 1140    Visit Number 16   Number of Visits 24   Date for OT Re-Evaluation 05/18/14   Authorization Type AARP Medicare Complete   Authorization Time Period before 20th visit   Authorization - Visit Number 16   Authorization - Number of Visits 20   OT Start Time 1105   OT Stop Time 1145   OT Time Calculation (min) 40 min   Activity Tolerance Patient tolerated treatment well   Behavior During Therapy [facility] for tasks assessed/performed      Past Medical History  Diagnosis Date  . Hypertension   . Hypercholesteremia   . Dementia     No past surgical history on file.  There were no vitals taken for this visit.  Visit Diagnosis:  Pain in joint, shoulder region, left  Shoulder weakness  Decreased range of motion of shoulder, left      Subjective Assessment - 05/04/14 1117    Symptoms S: My arms didn't hurt last time   Currently in Pain? No/denies          [facility] Assessment - 05/04/14 1119    Precautions   Precautions None               OT Treatments/Exercises (OP) - 05/04/14 1119    Shoulder Exercises: Supine   Protraction PROM;5 reps;Strengthening;10 reps;Weights   Protraction Weight (lbs) 1   Horizontal ABduction PROM;5 reps;Strengthening;10 reps;Weights   Horizontal ABduction Weight (lbs) 1   External Rotation PROM;5 reps;Strengthening;10 reps   Internal Rotation PROM;5 reps;Strengthening;Weights;10 reps   Internal Rotation Weight (lbs) 1   Flexion PROM;5 reps;Strengthening;10 reps;Weights   Shoulder Flexion Weight (lbs) 1   ABduction PROM;5 reps;AROM;10 reps;Weights   Shoulder ABduction  Weight (lbs) 1   Shoulder Exercises: Standing   Protraction Strengthening;10 reps;Weights   Protraction Weight (lbs) 1   Horizontal ABduction Strengthening;10 reps;Weights   Horizontal ABduction Weight (lbs) 1   External Rotation Strengthening;10 reps;Weights   External Rotation Weight (lbs) 1   Internal Rotation Strengthening;10 reps   Internal Rotation Weight (lbs) 1   Flexion Strengthening;10 reps   Shoulder Flexion Weight (lbs) 1   ABduction Strengthening;10 reps   Shoulder ABduction Weight (lbs) 1   Shoulder Exercises: ROM/Strengthening   UBE (Upper Arm Bike) Level 1 3' forward 3' reverse   Over Head Lace 2 min   X to V Arms 15X   Proximal Shoulder Strengthening, Supine 10X with 1#. no rest breaks.    Proximal Shoulder Strengthening, Seated 15X with no rest breaks   Manual Therapy   Manual Therapy Myofascial release   Myofascial Release Muscle energy technique to left anterior deltoid to relax tone and muscle spasm and improve range of motion                  OT Short Term Goals - 04/25/14 1108    OT SHORT TERM GOAL #1   Title Pt will be independent in HEP   OT SHORT TERM GOAL #2   Title Pt will imporve LUE PROM to [facility] for improved ability to write checks   OT SHORT TERM GOAL #3   Title  Pt will decrease fascial restictions to min-mod in LUE for decreased pain with movement   OT SHORT TERM GOAL #4   Title Pt will decrease pain in LUE during daily tasks to less than 4/10           OT Long Term Goals - 04/25/14 1108    OT LONG TERM GOAL #1   Title Pt will achieve highest level of functioning in all ADL, IADL, and leisure tasks using LUE as dominant   Status On-going   OT LONG TERM GOAL #2   Title Pt will achieve LUE AROM to near [facility] for improved ability to fix her hair using her LUE   Status On-going   OT LONG TERM GOAL #3   Title Pt will improve strength in LUE to atleast 4/5 for improved abilty to reach into kitchen cabinets   Status On-going   OT LONG  TERM GOAL #4   Title Pt will decrease fascial restictions to minimal in LUE for decreased pain overall during daily tasks.   Status On-going   OT LONG TERM GOAL #5   Title Pt will report pain in LUE during daily tasks of less than 2/10.   Status On-going               Plan - 05/04/14 1141    Clinical Impression Statement P: Add 1# handweight standing. pt required floor mirror for visual cueing during X to V arms.    Plan P: Cont to work on increasing LUE shoulder range of motion. Add 1lb weight to overhead lacing. Add functional reaching task.        Problem List Patient Active Problem List   Diagnosis Date Noted  . Proximal humerus fracture 02/28/2014    Limmie Patricia, OTR/L,CBIS  [phone]  05/04/2014, 11:49 AM  [place] [facility] [address] [place], [place], [address] Phone: [phone]   Fax:  [phone]

## 2014-05-06 ENCOUNTER — Encounter (HOSPITAL_COMMUNITY): Payer: Self-pay

## 2014-05-06 ENCOUNTER — Ambulatory Visit (HOSPITAL_COMMUNITY)
Admission: RE | Admit: 2014-05-06 | Discharge: 2014-05-06 | Disposition: A | Discharge status: Home / Self Care | Payer: Medicare Other | Source: Ambulatory Visit | Attending: Orthopedic Surgery | Admitting: Orthopedic Surgery

## 2014-05-06 DIAGNOSIS — M25612 Stiffness of left shoulder, not elsewhere classified: Secondary | ICD-10-CM

## 2014-05-06 DIAGNOSIS — R29898 Other symptoms and signs involving the musculoskeletal system: Secondary | ICD-10-CM

## 2014-05-06 DIAGNOSIS — Z5189 Encounter for other specified aftercare: Secondary | ICD-10-CM | POA: Diagnosis not present

## 2014-05-06 DIAGNOSIS — M25512 Pain in left shoulder: Secondary | ICD-10-CM

## 2014-05-06 NOTE — Therapy (Signed)
[place] [address] [place], [place], [address] Phone: [phone]   Fax:  [phone]  Occupational Therapy Treatment  Patient Details  Name: Shawna Banks MRN: [id number] Date of Birth: March 13, 1946 Referring Provider:  Vickki Hearing, MD  Encounter Date: 05/06/2014      OT End of Session - 05/06/14 1100    Visit Number 17   Number of Visits 24   Date for OT Re-Evaluation 05/18/14   Authorization Type AARP Medicare Complete   Authorization Time Period before 20th visit   Authorization - Visit Number 17   Authorization - Number of Visits 20   OT Start Time 1022   OT Stop Time 1059   OT Time Calculation (min) 37 min   Activity Tolerance Patient tolerated treatment well   Behavior During Therapy [facility] for tasks assessed/performed      Past Medical History  Diagnosis Date  . Hypertension   . Hypercholesteremia   . Dementia     No past surgical history on file.  There were no vitals taken for this visit.  Visit Diagnosis:  Pain in joint, shoulder region, left  Shoulder weakness  Decreased range of motion of shoulder, left      Subjective Assessment - 05/06/14 1022    Symptoms "I can do more at home without as much help."   Limitations progress as tolerated   Currently in Pain? No/denies          [facility] OT Assessment - 05/06/14 0001    Precautions   Precautions None               OT Treatments/Exercises (OP) - 05/06/14 1023    Shoulder Exercises: Supine   Protraction PROM;5 reps;Strengthening;Weights;12 reps   Protraction Weight (lbs) 1   Horizontal ABduction PROM;5 reps;Strengthening;Weights;12 reps   Horizontal ABduction Weight (lbs) 1   External Rotation PROM;5 reps;Strengthening;Weights;12 reps   External Rotation Weight (lbs) 1   Internal Rotation PROM;5 reps;Strengthening;Weights;12 reps   Internal Rotation Weight (lbs) 1   Flexion PROM;5 reps;Strengthening;Weights;12 reps   Shoulder  Flexion Weight (lbs) 1   ABduction PROM;5 reps;AROM;Weights;12 reps   Shoulder ABduction Weight (lbs) 1   Shoulder Exercises: Standing   Protraction Strengthening;10 reps;Weights   Protraction Weight (lbs) 1   Horizontal ABduction Strengthening;10 reps;Weights   Horizontal ABduction Weight (lbs) 1   External Rotation Strengthening;10 reps;Weights   External Rotation Weight (lbs) 1   Internal Rotation Strengthening;10 reps   Internal Rotation Weight (lbs) 1   Flexion Strengthening;10 reps   Shoulder Flexion Weight (lbs) 1   ABduction Strengthening;10 reps   Shoulder ABduction Weight (lbs) 1   Shoulder Exercises: ROM/Strengthening   UBE (Upper Arm Bike) level 2, 3 min forward and 3 min back   Over Head Lace 2 min with 1# wrist weight   X to V Arms 10x with 1#   Proximal Shoulder Strengthening, Supine 12x with 1#, no rest breaks   Proximal Shoulder Strengthening, Seated 10x with 1# weights   Manual Therapy   Manual Therapy Myofascial release   Myofascial Release Myofascial release, manual stretching to left upper arm, scapular, trapezius, shoulder, and posterior shoulder region.                     OT Short Term Goals - 04/25/14 1108    OT SHORT TERM GOAL #1   Title Pt will be independent in HEP   OT SHORT TERM GOAL #2   Title  Pt will imporve LUE PROM to [facility] for improved ability to write checks   OT SHORT TERM GOAL #3   Title Pt will decrease fascial restictions to min-mod in LUE for decreased pain with movement   OT SHORT TERM GOAL #4   Title Pt will decrease pain in LUE during daily tasks to less than 4/10           OT Long Term Goals - 04/25/14 1108    OT LONG TERM GOAL #1   Title Pt will achieve highest level of functioning in all ADL, IADL, and leisure tasks using LUE as dominant   Status On-going   OT LONG TERM GOAL #2   Title Pt will achieve LUE AROM to near [facility] for improved ability to fix her hair using her LUE   Status On-going   OT LONG TERM GOAL #3    Title Pt will improve strength in LUE to atleast 4/5 for improved abilty to reach into kitchen cabinets   Status On-going   OT LONG TERM GOAL #4   Title Pt will decrease fascial restictions to minimal in LUE for decreased pain overall during daily tasks.   Status On-going   OT LONG TERM GOAL #5   Title Pt will report pain in LUE during daily tasks of less than 2/10.   Status On-going               Plan - 05/06/14 1101    Clinical Impression Statement Increased supine reps to 12 with good tolerance and little fatigue. Increased proximal shoudler strengthening to 12 reps and pt indicated no pain or fatigue. Pt also tolerated well standing reps.  Added 1# wrist weight to overhead laving activity - pt able to tolerate full 2 minutes.  Increased UBE resistance to Level 2, with good tolerance.     Plan P: functional reaching activity.  Increase standing reps.          Problem List Patient Active Problem List   Diagnosis Date Noted  . Proximal humerus fracture 02/28/2014    Marry GuanMarie Rawlings Wendle Kina, MS, OTR/L [facility] [phone] 05/06/2014, 11:02 AM  [place] [facility] [address] [place], [place], [address] Phone: [phone]   Fax:  [phone]

## 2014-05-09 ENCOUNTER — Ambulatory Visit (HOSPITAL_COMMUNITY)
Admission: RE | Admit: 2014-05-09 | Discharge: 2014-05-09 | Disposition: A | Discharge status: Home / Self Care | Payer: Medicare Other | Source: Ambulatory Visit | Attending: Family Medicine | Admitting: Family Medicine

## 2014-05-09 ENCOUNTER — Ambulatory Visit (HOSPITAL_COMMUNITY): Payer: Medicare Other

## 2014-05-09 ENCOUNTER — Encounter (HOSPITAL_COMMUNITY): Payer: Self-pay

## 2014-05-09 DIAGNOSIS — Z5189 Encounter for other specified aftercare: Secondary | ICD-10-CM | POA: Diagnosis not present

## 2014-05-09 DIAGNOSIS — M25512 Pain in left shoulder: Secondary | ICD-10-CM

## 2014-05-09 DIAGNOSIS — M25612 Stiffness of left shoulder, not elsewhere classified: Secondary | ICD-10-CM

## 2014-05-09 DIAGNOSIS — R29898 Other symptoms and signs involving the musculoskeletal system: Secondary | ICD-10-CM

## 2014-05-09 NOTE — Therapy (Signed)
[facility] [address] [place], [place], [address] Phone: [phone]   Fax:  [phone]  Occupational Therapy Treatment  Patient Details  Name: Shawna Banks MRN: [id number] Date of Birth: 04-19-1946 Referring Provider:  Milana ObeyKnowlton, Stephen D, MD  Encounter Date: 05/09/2014      OT End of Session - 05/09/14 1203    Visit Number 18   Number of Visits 24   Date for OT Re-Evaluation 05/18/14   Authorization Type A[employer] Medicare Complete   Authorization Time Period before 20th visit   Authorization - Visit Number 18   Authorization - Number of Visits 20   OT Start Time 1100   OT Stop Time 1145   OT Time Calculation (min) 45 min   Activity Tolerance Patient tolerated treatment well   Behavior During Therapy [facility] for tasks assessed/performed      Past Medical History  Diagnosis Date  . Hypertension   . Hypercholesteremia   . Dementia     No past surgical history on file.  There were no vitals taken for this visit.  Visit Diagnosis:  Pain in joint, shoulder region, left  Shoulder weakness  Decreased range of motion of shoulder, left      Subjective Assessment - 05/09/14 1115    Symptoms S: My arm is feeling real good.    Currently in Pain? No/denies          [facility] - 05/09/14 1117    Precautions   Precautions None               OT Treatments/Exercises (OP) - 05/09/14 1115    Shoulder Exercises: Supine   Protraction PROM;5 reps;Strengthening;Weights;12 reps   Protraction Weight (lbs) 1   Horizontal ABduction PROM;5 reps;Strengthening;Weights;12 reps   Horizontal ABduction Weight (lbs) 1   External Rotation PROM;5 reps;Strengthening;Weights;12 reps   External Rotation Weight (lbs) 1   Internal Rotation PROM;5 reps;Strengthening;Weights;12 reps   Internal Rotation Weight (lbs) 1   Flexion PROM;5 reps;Strengthening;Weights;12 reps   Shoulder Flexion Weight (lbs) 1   ABduction PROM;5  reps;AROM;Weights;12 reps   Shoulder ABduction Weight (lbs) 1   Shoulder Exercises: Standing   Protraction Strengthening;12 reps   Protraction Weight (lbs) 1   Horizontal ABduction Strengthening;12 reps   Horizontal ABduction Weight (lbs) 1   External Rotation Strengthening;12 reps   External Rotation Weight (lbs) 1   Internal Rotation Strengthening;12 reps   Internal Rotation Weight (lbs) 1   Flexion Strengthening;12 reps   Shoulder Flexion Weight (lbs) 1   ABduction Strengthening;12 reps   Shoulder ABduction Weight (lbs) 1   Extension Theraband;15 reps   Theraband Level (Shoulder Extension) Level 2 (Red)   Row Theraband;15 reps   Theraband Level (Shoulder Row) Level 2 (Red)   Retraction Theraband;15 reps   Theraband Level (Shoulder Retraction) Level 2 (Red)   Shoulder Exercises: ROM/Strengthening   Over Head Lace 2 min with 1# wrist weight   X to V Arms 10x with 1#   Proximal Shoulder Strengthening, Supine 12x with 1#, no rest breaks   Proximal Shoulder Strengthening, Seated 12X with 1#   Ball on Wall 1; flexion 1' abduction green   Shoulder Exercises: Stretch   Other Shoulder Stretches Wall shoulder flexion; 30" hold   Other Shoulder Stretches Wall shoulder abduction; 30" hold   Manual Therapy   Manual Therapy Myofascial release   Myofascial Release Myofascial release, manual stretching to left upper arm, scapular, trapezius, shoulder, and posterior shoulder region.  OT Short Term Goals - 04/25/14 1108    OT SHORT TERM GOAL #1   Title Pt will be independent in HEP   OT SHORT TERM GOAL #2   Title Pt will imporve LUE PROM to [facility] for improved ability to write checks   OT SHORT TERM GOAL #3   Title Pt will decrease fascial restictions to min-mod in LUE for decreased pain with movement   OT SHORT TERM GOAL #4   Title Pt will decrease pain in LUE during daily tasks to less than 4/10           OT Long Term Goals - 04/25/14 1108    OT LONG  TERM GOAL #1   Title Pt will achieve highest level of functioning in all ADL, IADL, and leisure tasks using LUE as dominant   Status On-going   OT LONG TERM GOAL #2   Title Pt will achieve LUE AROM to near [facility] for improved ability to fix her hair using her LUE   Status On-going   OT LONG TERM GOAL #3   Title Pt will improve strength in LUE to atleast 4/5 for improved abilty to reach into kitchen cabinets   Status On-going   OT LONG TERM GOAL #4   Title Pt will decrease fascial restictions to minimal in LUE for decreased pain overall during daily tasks.   Status On-going   OT LONG TERM GOAL #5   Title Pt will report pain in LUE during daily tasks of less than 2/10.   Status On-going               Plan - 05/09/14 1204    Clinical Impression Statement A: Increased reps standing with 1#, added flexion and abduction stretch using wall. Needed min physical cues for theraband and ball on th wall exercises for proper form.    Plan P: functional reaching activity.         Problem List Patient Active Problem List   Diagnosis Date Noted  . Proximal humerus fracture 02/28/2014    Limmie Patricia, OTR/L,CBIS  [phone]  05/09/2014, 12:07 PM  [place] [facility] [address] [place], [place], [address] Phone: [phone]   Fax:  [phone]

## 2014-05-11 ENCOUNTER — Ambulatory Visit (HOSPITAL_COMMUNITY)
Admission: RE | Admit: 2014-05-11 | Discharge: 2014-05-11 | Disposition: A | Discharge status: Home / Self Care | Payer: Medicare Other | Source: Ambulatory Visit | Attending: Orthopedic Surgery | Admitting: Orthopedic Surgery

## 2014-05-11 DIAGNOSIS — M25612 Stiffness of left shoulder, not elsewhere classified: Secondary | ICD-10-CM

## 2014-05-11 DIAGNOSIS — M25512 Pain in left shoulder: Secondary | ICD-10-CM

## 2014-05-11 DIAGNOSIS — R29898 Other symptoms and signs involving the musculoskeletal system: Secondary | ICD-10-CM

## 2014-05-11 DIAGNOSIS — Z5189 Encounter for other specified aftercare: Secondary | ICD-10-CM | POA: Diagnosis not present

## 2014-05-11 NOTE — Therapy (Signed)
[place] [facility] [address] [place], [place], [address] Phone: [phone]   Fax:  [phone]  Occupational Therapy Treatment  Patient Details  Name: Shawna Banks MRN: [id number] Date of Birth: 01/03/1946 Referring Provider:  Vickki Hearing, MD  Encounter Date: 05/11/2014      OT End of Session - 05/11/14 1152    Visit Number 19   Number of Visits 24   Date for OT Re-Evaluation 05/18/14   Authorization Type AARP Medicare Complete   Authorization Time Period before 20th visit   Authorization - Visit Number 19   Authorization - Number of Visits 20   OT Start Time 1112   OT Stop Time 1155   OT Time Calculation (min) 43 min   Activity Tolerance Patient tolerated treatment well   Behavior During Therapy [facility] for tasks assessed/performed      Past Medical History  Diagnosis Date  . Hypertension   . Hypercholesteremia   . Dementia     No past surgical history on file.  There were no vitals taken for this visit.  Visit Diagnosis:  Pain in joint, shoulder region, left  Shoulder weakness  Decreased range of motion of shoulder, left      Subjective Assessment - 05/11/14 1113    Symptoms S:   My girls have noticed that I am doing more for myself.   Limitations progress as tolerated   Currently in Pain? No/denies   Pain Score 0-No pain          [facility] OT Assessment - 05/11/14 1114    Assessment   Diagnosis Left proximal humerus fracture   Precautions   Precautions None               OT Treatments/Exercises (OP) - 05/11/14 0001    Exercises   Exercises Shoulder   Shoulder Exercises: Supine   Protraction PROM;5 reps;Strengthening;10 reps   Protraction Weight (lbs) 2   Horizontal ABduction PROM;5 reps;Strengthening;10 reps   Horizontal ABduction Weight (lbs) 2   External Rotation PROM;5 reps;Strengthening;10 reps   External Rotation Weight (lbs) 2   Internal Rotation PROM;5 reps;Strengthening;10 reps    Internal Rotation Weight (lbs) 2   Flexion PROM;5 reps;Strengthening;10 reps   Shoulder Flexion Weight (lbs) 2   ABduction PROM;5 reps;Strengthening;10 reps   Shoulder ABduction Weight (lbs) 2   Shoulder Exercises: Standing   Protraction Strengthening;10 reps   Protraction Weight (lbs) 2   Horizontal ABduction Strengthening;10 reps   Horizontal ABduction Weight (lbs) 2   External Rotation Strengthening;10 reps   External Rotation Weight (lbs) 2   Internal Rotation Strengthening;10 reps   Internal Rotation Weight (lbs) 2   Flexion Strengthening;10 reps   Shoulder Flexion Weight (lbs) 2   ABduction Strengthening;10 reps   Shoulder ABduction Weight (lbs) 2   Extension Strengthening;10 reps   Extension Weight (lbs) 2   Row Strengthening;10 reps   Row Weight (lbs) 2   Retraction Strengthening;10 reps   Retraction Weight (lbs) 2   Shoulder Exercises: ROM/Strengthening   UBE (Upper Arm Bike) level 2, 3 min forward and 3 min back   Proximal Shoulder Strengthening, Supine 10X each with 2# no rest break   Proximal Shoulder Strengthening, Seated 10X with 2# min cueing to keep elbow straight   Ball on Wall 1; flexion 1' abduction green   Functional Reaching Activities   Mid Level from waist to shoulder height with 2# cuff weight to organize cabinet.  did so without difficulty  Manual Therapy   Manual Therapy Myofascial release   Myofascial Release Myofascial release, manual stretching to left upper arm, scapular, trapezius, shoulder, and posterior shoulder region.                   OT Short Term Goals - 04/25/14 1108    OT SHORT TERM GOAL #1   Title Pt will be independent in HEP   OT SHORT TERM GOAL #2   Title Pt will imporve LUE [facility] for improved ability to write checks   OT SHORT TERM GOAL #3   Title Pt will decrease fascial restictions to min-mod in LUE for decreased pain with movement   OT SHORT TERM GOAL #4   Title Pt will decrease pain in LUE during daily  tasks to less than 4/10           OT Long Term Goals - 04/25/14 1108    OT LONG TERM GOAL #1   Title Pt will achieve highest level of functioning in all ADL, IADL, and leisure tasks using LUE as dominant   Status On-going   OT LONG TERM GOAL #2   Title Pt will achieve LUE AROM to near [facility] for improved ability to fix her hair using her LUE   Status On-going   OT LONG TERM GOAL #3   Title Pt will improve strength in LUE to atleast 4/5 for improved abilty to reach into kitchen cabinets   Status On-going   OT LONG TERM GOAL #4   Title Pt will decrease fascial restictions to minimal in LUE for decreased pain overall during daily tasks.   Status On-going   OT LONG TERM GOAL #5   Title Pt will report pain in LUE during daily tasks of less than 2/10.   Status On-going               Plan - 05/11/14 1152    Clinical Impression Statement A:  Improved to 2# resistance with strengthening this date.  Required minimal tactile cues to maintain proper body mechanics while completing standing strengthening.    Plan P:  Attempt wall pushups and overhead lace, update g code.        Problem List Patient Active Problem List   Diagnosis Date Noted  . Proximal humerus fracture 02/28/2014    Shirlean MylarBethany H. Myasia Sinatra, OTR/L [phone]  05/11/2014, 11:55 AM  [place] [facility] [address] [place], [place], [address] Phone: [phone]   Fax:  [phone]

## 2014-05-13 ENCOUNTER — Ambulatory Visit (HOSPITAL_COMMUNITY): Payer: Medicare Other

## 2014-05-16 ENCOUNTER — Encounter (HOSPITAL_COMMUNITY): Payer: Medicare Other

## 2014-05-17 ENCOUNTER — Ambulatory Visit (HOSPITAL_COMMUNITY): Payer: Medicare Other

## 2014-05-18 ENCOUNTER — Ambulatory Visit (HOSPITAL_COMMUNITY)
Admission: RE | Admit: 2014-05-18 | Discharge: 2014-05-18 | Disposition: A | Discharge status: Home / Self Care | Payer: Medicare Other | Source: Ambulatory Visit | Attending: Orthopedic Surgery | Admitting: Orthopedic Surgery

## 2014-05-18 DIAGNOSIS — M25612 Stiffness of left shoulder, not elsewhere classified: Secondary | ICD-10-CM

## 2014-05-18 DIAGNOSIS — Z5189 Encounter for other specified aftercare: Secondary | ICD-10-CM | POA: Diagnosis not present

## 2014-05-18 DIAGNOSIS — R29898 Other symptoms and signs involving the musculoskeletal system: Secondary | ICD-10-CM

## 2014-05-18 DIAGNOSIS — M25512 Pain in left shoulder: Secondary | ICD-10-CM

## 2014-05-18 NOTE — Addendum Note (Signed)
Encounter addended by: Jacqualine CodeBethany Helene Renell Coaxum, OTR on: 05/18/2014 11:43 AM<BR>     Documentation filed: Clinical Notes

## 2014-05-18 NOTE — Therapy (Signed)
[place], [place], [address] Phone: [phone]   Fax:  [phone]  Occupational Therapy Treatment  Patient Details  Name: Shawna Banks MRN: [id number] Date of Birth: 1946/02/23 Referring Provider:  Carole Civil, MD  Encounter Date: 05/18/2014      OT End of Session - 05/18/14 1125    Visit Number 20   Number of Visits 24   Date for OT Re-Evaluation 05/18/14   Authorization Type AARP Medicare Complete   Authorization Time Period before 20th visit   Authorization - Visit Number 20   Authorization - Number of Visits 20   OT Start Time 1107   OT Stop Time 1137   OT Time Calculation (min) 30 min   Activity Tolerance Patient tolerated treatment well      Past Medical History  Diagnosis Date  . Hypertension   . Hypercholesteremia   . Dementia     No past surgical history on file.  There were no vitals taken for this visit.  Visit Diagnosis:  Shoulder weakness  Pain in joint, shoulder region, left  Decreased range of motion of shoulder, left      Subjective Assessment - 05/18/14 1108    Symptoms S:  I can do everything I want to do with my arm. It doesnt hurt anymore.   Currently in Pain? No/denies   Pain Score 0-No pain          [facility] OT Assessment - 05/18/14 0001    Assessment   Diagnosis Left proximal humerus fracture   Precautions   Precautions None   ADL   ADL comments Patient is doing everything she wants with her left arm without difficulty or pain   Observation/Other Assessments   Focus on Therapeutic Outcomes (FOTO)  88% independent   Palpation   Palpation minimal fascial restrictions   AROM   Overall AROM Comments assessed in seated (04/20/14)   Left Shoulder Flexion 125 Degrees  120   Left Shoulder ABduction 125 Degrees  100   Left Shoulder Internal Rotation 90 Degrees  90 degrees   Left Shoulder External Rotation 65 Degrees  55   PROM   Overall PROM  Comments PROM is [facility]   Strength   Overall Strength Comments assessed in seated (04/20/14)   Left Shoulder Flexion --  5-/5 (3+/5)   Left Shoulder Extension --  5/5 (3-/5)   Left Shoulder ABduction --  5-/5 (3+/5)   Left Shoulder Internal Rotation --  5-/5 (3+/5)   Left Shoulder External Rotation --  5-/5 (3+/5)               OT Treatments/Exercises (OP) - 05/18/14 1118    Shoulder Exercises: Supine   Protraction PROM;5 reps   Horizontal ABduction PROM;5 reps   External Rotation PROM;5 reps   Internal Rotation PROM;5 reps   Flexion PROM;5 reps   ABduction PROM;5 reps   Manual Therapy   Manual Therapy Myofascial release   Myofascial Release Myofascial release, manual stretching to left upper arm, scapular, trapezius, shoulder, and posterior shoulder region.                   OT Short Term Goals - 04/25/14 1108    OT SHORT TERM GOAL #1   Title Pt will be independent in HEP   OT SHORT TERM GOAL #2   Title Pt will imporve LUE PROM to [facility] for improved ability to write checks   OT SHORT  TERM GOAL #3   Title Pt will decrease fascial restictions to min-mod in LUE for decreased pain with movement   OT SHORT TERM GOAL #4   Title Pt will decrease pain in LUE during daily tasks to less than 4/10           OT Long Term Goals - 06/09/2014 1131    OT LONG TERM GOAL #1   Title Pt will achieve highest level of functioning in all ADL, IADL, and leisure tasks using LUE as dominant   Status Achieved   OT LONG TERM GOAL #2   Title Pt will achieve LUE AROM to near [facility] for improved ability to fix her hair using her LUE   Status Achieved   OT LONG TERM GOAL #3   Title Pt will improve strength in LUE to atleast 4/5 for improved abilty to reach into kitchen cabinets   Status Achieved   OT LONG TERM GOAL #4   Title Pt will decrease fascial restictions to minimal in LUE for decreased pain overall during daily tasks.   Status Achieved   OT LONG TERM GOAL #5   Title Pt will  report pain in LUE during daily tasks of less than 2/10.   Status Achieved               Plan - 09-Jun-2014 1137    Clinical Impression Statement A:  Patient has met all short term and long term goals. She is able to complete all desired activities at home without asistance and without pain.     Plan P:  DC from skilled OT services this date.    Consulted and Agree with Plan of Care Patient          G-Codes - 2014-06-09 1137    Functional Limitation Self care   Self Care Goal Status [phone]) At least 20 percent but less than 40 percent impaired, limited or restricted   Self Care Discharge Status [phone]) At least 1 percent but less than 20 percent impaired, limited or restricted      Problem List Patient Active Problem List   Diagnosis Date Noted  . Proximal humerus fracture 02/28/2014   OCCUPATIONAL THERAPY DISCHARGE SUMMARY  Visits from Start of Care: 20 Current functional level related to goals / functional outcomes: Patient is doing everything she wants with her left arm without difficulty or pain    88% independent    Remaining deficits: n/a   Education / Equipment: Strengthening HEP  Plan: Patient agrees to discharge.  Patient goals were met. Patient is being discharged due to meeting the stated rehab goals.  ?????       Vangie Bicker, OTR/L [phone]  06/09/14, 11:40 AM  [place], [place], [address] Phone: [phone]   Fax:  [phone]

## 2014-05-20 ENCOUNTER — Encounter (HOSPITAL_COMMUNITY): Payer: Medicare Other

## 2014-05-24 ENCOUNTER — Encounter (HOSPITAL_COMMUNITY): Payer: Medicare Other | Admitting: Specialist

## 2015-03-12 ENCOUNTER — Emergency Department (HOSPITAL_COMMUNITY): Payer: Medicare Other

## 2015-03-12 ENCOUNTER — Inpatient Hospital Stay (HOSPITAL_COMMUNITY)
Admission: EM | Admit: 2015-03-12 | Discharge: 2015-03-15 | DRG: 064 | Disposition: A | Discharge status: Home / Self Care | Payer: Medicare Other | Attending: Neurology | Admitting: Neurology

## 2015-03-12 ENCOUNTER — Inpatient Hospital Stay (HOSPITAL_COMMUNITY): Payer: Medicare Other

## 2015-03-12 ENCOUNTER — Encounter (HOSPITAL_COMMUNITY): Payer: Self-pay | Admitting: Emergency Medicine

## 2015-03-12 DIAGNOSIS — I680 Cerebral amyloid angiopathy: Secondary | ICD-10-CM | POA: Diagnosis not present

## 2015-03-12 DIAGNOSIS — G9340 Encephalopathy, unspecified: Secondary | ICD-10-CM | POA: Diagnosis present

## 2015-03-12 DIAGNOSIS — J9600 Acute respiratory failure, unspecified whether with hypoxia or hypercapnia: Secondary | ICD-10-CM | POA: Diagnosis present

## 2015-03-12 DIAGNOSIS — R001 Bradycardia, unspecified: Secondary | ICD-10-CM | POA: Diagnosis not present

## 2015-03-12 DIAGNOSIS — E7800 Pure hypercholesterolemia, unspecified: Secondary | ICD-10-CM | POA: Diagnosis present

## 2015-03-12 DIAGNOSIS — I952 Hypotension due to drugs: Secondary | ICD-10-CM | POA: Diagnosis present

## 2015-03-12 DIAGNOSIS — G936 Cerebral edema: Secondary | ICD-10-CM | POA: Diagnosis present

## 2015-03-12 DIAGNOSIS — G40901 Epilepsy, unspecified, not intractable, with status epilepticus: Secondary | ICD-10-CM | POA: Diagnosis not present

## 2015-03-12 DIAGNOSIS — E876 Hypokalemia: Secondary | ICD-10-CM | POA: Diagnosis not present

## 2015-03-12 DIAGNOSIS — R569 Unspecified convulsions: Secondary | ICD-10-CM | POA: Diagnosis present

## 2015-03-12 DIAGNOSIS — I611 Nontraumatic intracerebral hemorrhage in hemisphere, cortical: Secondary | ICD-10-CM | POA: Diagnosis present

## 2015-03-12 DIAGNOSIS — G40201 Localization-related (focal) (partial) symptomatic epilepsy and epileptic syndromes with complex partial seizures, not intractable, with status epilepticus: Secondary | ICD-10-CM | POA: Diagnosis present

## 2015-03-12 DIAGNOSIS — E854 Organ-limited amyloidosis: Secondary | ICD-10-CM | POA: Diagnosis present

## 2015-03-12 DIAGNOSIS — R402122 Coma scale, eyes open, to pain, at arrival to emergency department: Secondary | ICD-10-CM | POA: Diagnosis present

## 2015-03-12 DIAGNOSIS — E785 Hyperlipidemia, unspecified: Secondary | ICD-10-CM | POA: Diagnosis not present

## 2015-03-12 DIAGNOSIS — J9601 Acute respiratory failure with hypoxia: Secondary | ICD-10-CM | POA: Diagnosis not present

## 2015-03-12 DIAGNOSIS — R4182 Altered mental status, unspecified: Secondary | ICD-10-CM | POA: Diagnosis present

## 2015-03-12 DIAGNOSIS — I161 Hypertensive emergency: Secondary | ICD-10-CM | POA: Diagnosis present

## 2015-03-12 DIAGNOSIS — I10 Essential (primary) hypertension: Secondary | ICD-10-CM | POA: Diagnosis not present

## 2015-03-12 DIAGNOSIS — T41295A Adverse effect of other general anesthetics, initial encounter: Secondary | ICD-10-CM | POA: Diagnosis present

## 2015-03-12 DIAGNOSIS — R4701 Aphasia: Secondary | ICD-10-CM | POA: Diagnosis present

## 2015-03-12 DIAGNOSIS — R402212 Coma scale, best verbal response, none, at arrival to emergency department: Secondary | ICD-10-CM | POA: Diagnosis present

## 2015-03-12 DIAGNOSIS — F0390 Unspecified dementia without behavioral disturbance: Secondary | ICD-10-CM | POA: Diagnosis present

## 2015-03-12 DIAGNOSIS — I619 Nontraumatic intracerebral hemorrhage, unspecified: Secondary | ICD-10-CM | POA: Diagnosis present

## 2015-03-12 DIAGNOSIS — R29704 NIHSS score 4: Secondary | ICD-10-CM | POA: Diagnosis present

## 2015-03-12 DIAGNOSIS — Z978 Presence of other specified devices: Secondary | ICD-10-CM

## 2015-03-12 DIAGNOSIS — D696 Thrombocytopenia, unspecified: Secondary | ICD-10-CM | POA: Diagnosis not present

## 2015-03-12 DIAGNOSIS — R402342 Coma scale, best motor response, flexion withdrawal, at arrival to emergency department: Secondary | ICD-10-CM | POA: Diagnosis present

## 2015-03-12 DIAGNOSIS — I6789 Other cerebrovascular disease: Secondary | ICD-10-CM | POA: Diagnosis not present

## 2015-03-12 LAB — DIFFERENTIAL
BASOS ABS: 0 10*3/uL (ref 0.0–0.1)
BASOS PCT: 0 %
EOS ABS: 0.1 10*3/uL (ref 0.0–0.7)
Eosinophils Relative: 1 %
Lymphocytes Relative: 14 %
Lymphs Abs: 1 10*3/uL (ref 0.7–4.0)
Monocytes Absolute: 0.4 10*3/uL (ref 0.1–1.0)
Monocytes Relative: 6 %
NEUTROS ABS: 5.3 10*3/uL (ref 1.7–7.7)
NEUTROS PCT: 79 %

## 2015-03-12 LAB — BLOOD GAS, ARTERIAL
Acid-base deficit: 1 mmol/L (ref 0.0–2.0)
Bicarbonate: 23.2 mEq/L (ref 20.0–24.0)
DRAWN BY: 418751
FIO2: 100
MECHVT: 500 mL
O2 Saturation: 99.6 %
PEEP/CPAP: 5 cmH2O
PO2 ART: 420 mmHg — AB (ref 80.0–100.0)
Patient temperature: 98.6
RATE: 14 resp/min
TCO2: 24.4 mmol/L (ref 0–100)
pCO2 arterial: 38.3 mmHg (ref 35.0–45.0)
pH, Arterial: 7.399 (ref 7.350–7.450)

## 2015-03-12 LAB — URINALYSIS, ROUTINE W REFLEX MICROSCOPIC
BILIRUBIN URINE: NEGATIVE
GLUCOSE, UA: NEGATIVE mg/dL
Ketones, ur: NEGATIVE mg/dL
Leukocytes, UA: NEGATIVE
Nitrite: NEGATIVE
PROTEIN: NEGATIVE mg/dL
Specific Gravity, Urine: 1.014 (ref 1.005–1.030)
pH: 6.5 (ref 5.0–8.0)

## 2015-03-12 LAB — URINE MICROSCOPIC-ADD ON

## 2015-03-12 LAB — COMPREHENSIVE METABOLIC PANEL
ALBUMIN: 4.2 g/dL (ref 3.5–5.0)
ALT: 11 U/L — ABNORMAL LOW (ref 14–54)
AST: 19 U/L (ref 15–41)
Alkaline Phosphatase: 76 U/L (ref 38–126)
Anion gap: 7 (ref 5–15)
BUN: 13 mg/dL (ref 6–20)
CHLORIDE: 106 mmol/L (ref 101–111)
CO2: 28 mmol/L (ref 22–32)
Calcium: 8.5 mg/dL — ABNORMAL LOW (ref 8.9–10.3)
Creatinine, Ser: 0.8 mg/dL (ref 0.44–1.00)
GFR calc Af Amer: >60 mL/min (ref >60)
GFR calc non Af Amer: >60 mL/min (ref >60)
GLUCOSE: 129 mg/dL — AB (ref 65–99)
POTASSIUM: 2.7 mmol/L — AB (ref 3.5–5.1)
SODIUM: 141 mmol/L (ref 135–145)
Total Bilirubin: 0.2 mg/dL — ABNORMAL LOW (ref 0.3–1.2)
Total Protein: 7 g/dL (ref 6.5–8.1)

## 2015-03-12 LAB — RAPID URINE DRUG SCREEN, HOSP PERFORMED
AMPHETAMINES: NOT DETECTED
BARBITURATES: NOT DETECTED
BENZODIAZEPINES: NOT DETECTED
COCAINE: NOT DETECTED
Opiates: NOT DETECTED
Tetrahydrocannabinol: NOT DETECTED

## 2015-03-12 LAB — APTT: aPTT: 27 seconds (ref 24–37)

## 2015-03-12 LAB — CBC
HCT: 41 % (ref 36.0–46.0)
Hemoglobin: 13.7 g/dL (ref 12.0–15.0)
MCH: 31 pg (ref 26.0–34.0)
MCHC: 33.4 g/dL (ref 30.0–36.0)
MCV: 92.8 fL (ref 78.0–100.0)
Platelets: 153 10*3/uL (ref 150–400)
RBC: 4.42 MIL/uL (ref 3.87–5.11)
RDW: 14.2 % (ref 11.5–15.5)
WBC: 6.7 10*3/uL (ref 4.0–10.5)

## 2015-03-12 LAB — PROTIME-INR
INR: 1.14 (ref 0.00–1.49)
Prothrombin Time: 14.8 seconds (ref 11.6–15.2)

## 2015-03-12 LAB — I-STAT TROPONIN, ED: TROPONIN I, POC: 0 ng/mL (ref 0.00–0.08)

## 2015-03-12 LAB — TRIGLYCERIDES: TRIGLYCERIDES: 52 mg/dL (ref <150)

## 2015-03-12 LAB — I-STAT CHEM 8, ED
BUN: 12 mg/dL (ref 6–20)
CALCIUM ION: 1.13 mmol/L (ref 1.13–1.30)
CHLORIDE: 102 mmol/L (ref 101–111)
CREATININE: 0.8 mg/dL (ref 0.44–1.00)
GLUCOSE: 125 mg/dL — AB (ref 65–99)
HCT: 42 % (ref 36.0–46.0)
HEMOGLOBIN: 14.3 g/dL (ref 12.0–15.0)
Potassium: 2.8 mmol/L — ABNORMAL LOW (ref 3.5–5.1)
Sodium: 144 mmol/L (ref 135–145)
TCO2: 27 mmol/L (ref 0–100)

## 2015-03-12 LAB — TYPE AND SCREEN
ABO/RH(D): A POS
Antibody Screen: NEGATIVE

## 2015-03-12 LAB — ETHANOL

## 2015-03-12 MED ORDER — LABETALOL HCL 5 MG/ML IV SOLN
INTRAVENOUS | Status: AC
  Filled 2015-03-12: qty 4

## 2015-03-12 MED ORDER — ROCURONIUM BROMIDE 50 MG/5ML IV SOLN
70.0000 mg | Freq: Once | INTRAVENOUS | Status: AC
  Administered 2015-03-12: 70 mg via INTRAVENOUS
  Filled 2015-03-12: qty 7

## 2015-03-12 MED ORDER — NICARDIPINE HCL IN NACL 20-0.86 MG/200ML-% IV SOLN
INTRAVENOUS | Status: AC
Start: 2015-03-12 — End: 2015-03-12
  Filled 2015-03-12: qty 200

## 2015-03-12 MED ORDER — STROKE: EARLY STAGES OF RECOVERY BOOK
Freq: Once | Status: DC
  Filled 2015-03-12 (×2): qty 1

## 2015-03-12 MED ORDER — ONDANSETRON HCL 4 MG/2ML IJ SOLN
INTRAMUSCULAR | Status: AC
  Administered 2015-03-12: 4 mg
  Filled 2015-03-12: qty 2

## 2015-03-12 MED ORDER — NICARDIPINE HCL IN NACL 20-0.86 MG/200ML-% IV SOLN
3.0000 mg/h | INTRAVENOUS | Status: DC
  Administered 2015-03-12: 5 mg/h via INTRAVENOUS

## 2015-03-12 MED ORDER — LABETALOL HCL 5 MG/ML IV SOLN
10.0000 mg | INTRAVENOUS | Status: DC | PRN
  Administered 2015-03-14: 20 mg via INTRAVENOUS
  Filled 2015-03-12: qty 4

## 2015-03-12 MED ORDER — ETOMIDATE 2 MG/ML IV SOLN
12.0000 mg | Freq: Once | INTRAVENOUS | Status: AC
  Administered 2015-03-12: 12 mg via INTRAVENOUS

## 2015-03-12 MED ORDER — FOSPHENYTOIN SODIUM 500 MG PE/10ML IJ SOLN
20.0000 mg/kg | Freq: Once | INTRAMUSCULAR | Status: AC
  Administered 2015-03-12: 1134 mg via INTRAVENOUS
  Filled 2015-03-12: qty 22.68

## 2015-03-12 MED ORDER — PROPOFOL 1000 MG/100ML IV EMUL
0.0000 ug/kg/min | INTRAVENOUS | Status: DC
  Administered 2015-03-13: 40 ug/kg/min via INTRAVENOUS
  Administered 2015-03-13: 5 ug/kg/min via INTRAVENOUS
  Filled 2015-03-12 (×2): qty 100

## 2015-03-12 MED ORDER — CHLORHEXIDINE GLUCONATE 0.12% ORAL RINSE (MEDLINE KIT)
15.0000 mL | Freq: Two times a day (BID) | OROMUCOSAL | Status: DC
  Administered 2015-03-12 – 2015-03-14 (×4): 15 mL via OROMUCOSAL

## 2015-03-12 MED ORDER — LABETALOL HCL 5 MG/ML IV SOLN
INTRAVENOUS | Status: AC
  Filled 2015-03-12: qty 20

## 2015-03-12 MED ORDER — ACETAMINOPHEN 650 MG RE SUPP: 650.0000 mg | RECTAL | Status: DC | PRN

## 2015-03-12 MED ORDER — FENTANYL CITRATE (PF) 100 MCG/2ML IJ SOLN
50.0000 ug | INTRAMUSCULAR | Status: DC | PRN
  Administered 2015-03-12: 50 ug via INTRAVENOUS

## 2015-03-12 MED ORDER — ONDANSETRON HCL 4 MG/2ML IJ SOLN
4.0000 mg | Freq: Once | INTRAMUSCULAR | Status: AC
  Administered 2015-03-12: 4 mg via INTRAVENOUS
  Filled 2015-03-12: qty 2

## 2015-03-12 MED ORDER — SENNOSIDES-DOCUSATE SODIUM 8.6-50 MG PO TABS
1.0000 | ORAL_TABLET | Freq: Two times a day (BID) | ORAL | Status: DC
  Administered 2015-03-12 – 2015-03-15 (×4): 1 via ORAL
  Filled 2015-03-12 (×4): qty 1

## 2015-03-12 MED ORDER — FENTANYL CITRATE (PF) 100 MCG/2ML IJ SOLN
50.0000 ug | INTRAMUSCULAR | Status: DC | PRN
  Filled 2015-03-12: qty 2

## 2015-03-12 MED ORDER — LABETALOL HCL 5 MG/ML IV SOLN
10.0000 mg | Freq: Once | INTRAVENOUS | Status: AC
  Administered 2015-03-12: 10 mg via INTRAVENOUS

## 2015-03-12 MED ORDER — ANTISEPTIC ORAL RINSE SOLUTION (CORINZ)
7.0000 mL | Freq: Four times a day (QID) | OROMUCOSAL | Status: DC
  Administered 2015-03-12 – 2015-03-14 (×6): 7 mL via OROMUCOSAL

## 2015-03-12 MED ORDER — PANTOPRAZOLE SODIUM 40 MG IV SOLR
40.0000 mg | Freq: Every day | INTRAVENOUS | Status: DC
  Administered 2015-03-12: 40 mg via INTRAVENOUS
  Filled 2015-03-12: qty 40

## 2015-03-12 MED ORDER — LABETALOL HCL 5 MG/ML IV SOLN
0.5000 mg/min | INTRAVENOUS | Status: DC
  Administered 2015-03-12: 2 mg/min via INTRAVENOUS
  Filled 2015-03-12 (×2): qty 100

## 2015-03-12 MED ORDER — NITROPRUSSIDE SODIUM 25 MG/ML IV SOLN: 0.0000 ug/kg/min | INTRAVENOUS | Status: DC

## 2015-03-12 MED ORDER — SODIUM CHLORIDE 0.9 % IV SOLN
1000.0000 mg | Freq: Two times a day (BID) | INTRAVENOUS | Status: DC
  Administered 2015-03-12 – 2015-03-14 (×4): 1000 mg via INTRAVENOUS
  Filled 2015-03-12 (×6): qty 10

## 2015-03-12 MED ORDER — LORAZEPAM 2 MG/ML IJ SOLN
1.0000 mg | Freq: Once | INTRAMUSCULAR | Status: AC
  Administered 2015-03-12: 1 mg via INTRAVENOUS
  Filled 2015-03-12: qty 1

## 2015-03-12 MED ORDER — ACETAMINOPHEN 325 MG PO TABS: 650.0000 mg | ORAL_TABLET | ORAL | Status: DC | PRN

## 2015-03-12 NOTE — ED Notes (Signed)
[facility]. EDP has already spoke with neurosurgeon and [facility]

## 2015-03-12 NOTE — ED Notes (Signed)
No verbal  Temp foley placed    Family in waiting room

## 2015-03-12 NOTE — ED Notes (Signed)
[facility]

## 2015-03-12 NOTE — ED Notes (Signed)
Museum/gallery conservatorMelissa Charge RN at [facility]. Will receive report from McDonald's CorporationChristina RN (riding with EMS).

## 2015-03-12 NOTE — ED Provider Notes (Addendum)
CSN: [id number]     Arrival date & time 03/12/15  1614 History   First MD Initiated Contact with Patient 03/12/15 1632     Chief Complaint  Patient presents with  . Altered Mental Status     (Consider location/radiation/quality/duration/timing/severity/associated sxs/prior Treatment) Patient is a 69 y.o. female presenting with altered mental status.  Altered Mental Status Presenting symptoms: confusion   Severity:  Mild Most recent episode:  Today Episode history:  Single Duration:  4 hours Timing:  Constant Progression:  Worsening Chronicity:  New Context: dementia   Context: not alcohol use, not drug use, not head injury, not a nursing home resident, not a recent change in medication and not a recent illness   Associated symptoms: no seizures, no slurred speech and no weakness     Past Medical History  Diagnosis Date  . Hypertension   . Hypercholesteremia   . Dementia    History reviewed. No pertinent past surgical history. History reviewed. No pertinent family history. Social History  Substance Use Topics  . Smoking status: Never Smoker   . Smokeless tobacco: None  . Alcohol Use: No   OB History    No data available     Review of Systems  Unable to perform ROS: Mental status change  Neurological: Positive for speech difficulty. Negative for seizures, syncope and weakness.  Psychiatric/Behavioral: Positive for confusion.      Allergies  Review of patient's allergies indicates no known allergies.  Home Medications   Prior to Admission medications   Medication Sig Start Date End Date Taking? Authorizing Provider  Cholecalciferol (VITAMIN D-3) 1000 UNITS CAPS Take 1 capsule by mouth daily.    Historical Provider, MD  donepezil (ARICEPT) 10 MG tablet Take 10 mg by mouth at bedtime.    Historical Provider, MD  enalapril (VASOTEC) 20 MG tablet Take 20 mg by mouth daily.    Historical Provider, MD  oxyCODONE-acetaminophen (PERCOCET) 5-325 MG per tablet Take 1  tablet by mouth every 4 (four) hours as needed. 02/28/14   Vickki HearingStanley E Harrison, MD  simvastatin (ZOCOR) 40 MG tablet Take 40 mg by mouth daily.    Historical Provider, MD  Vitamin D, Ergocalciferol, (DRISDOL) 50000 UNITS CAPS capsule Take 50,000 Units by mouth every 7 (seven) days.    Historical Provider, MD   BP 180/97 mmHg  Pulse 77  Temp(Src) 98.5 F (36.9 C) (Oral)  Resp 18  Ht 5\' 3"  (1.6 m)  Wt 125 lb (56.7 kg)  BMI 22.15 kg/m2  SpO2 93% Physical Exam  Constitutional: She appears well-developed and well-nourished.  HENT:  Head: Normocephalic and atraumatic.  Eyes: Conjunctivae and EOM are normal. Pupils are equal, round, and reactive to light.  Neck: Normal range of motion.  Cardiovascular: Normal rate and regular rhythm.   Pulmonary/Chest: Effort normal and breath sounds normal. No stridor. No respiratory distress. She has no wheezes. She has no rales.  Abdominal: Soft. She exhibits no distension. There is no tenderness.  Musculoskeletal: Normal range of motion. She exhibits no edema or tenderness.  Neurological: She is alert.  Alert, follows commands, but is aphasic, no obvious facial droop, can hold both arms straight with slight left sided drift. Will not lift either leg to command, only repeating yes and thank you intermittently.   Skin: Skin is warm and dry.  Nursing note and vitals reviewed.   ED Course  Procedures (including critical care time)  CRITICAL CARE Performed by: Marily MemosMesner, Dawson Hollman  Total critical care time: 32 minutes  Critical care time was exclusive of separately billable procedures and treating other patients.  Critical care was necessary to treat or prevent imminent or life-threatening deterioration.  Critical care was time spent personally by me on the following activities: development of treatment plan with patient and/or surrogate as well as nursing, discussions with consultants, evaluation of patient's response to treatment, examination of patient,  obtaining history from patient or surrogate, ordering and performing treatments and interventions, ordering and review of laboratory studies, ordering and review of radiographic studies, pulse oximetry and re-evaluation of patient's condition.   Labs Review Labs Reviewed  COMPREHENSIVE METABOLIC PANEL - Abnormal; Notable for the following:    Potassium 2.7 (*)    Glucose, Bld 129 (*)    Calcium 8.5 (*)    ALT 11 (*)    Total Bilirubin 0.2 (*)    All other components within normal limits  I-STAT CHEM 8, ED - Abnormal; Notable for the following:    Potassium 2.8 (*)    Glucose, Bld 125 (*)    All other components within normal limits  URINE CULTURE  ETHANOL  PROTIME-INR  APTT  CBC  DIFFERENTIAL  URINE RAPID DRUG SCREEN, HOSP PERFORMED  URINALYSIS, ROUTINE W REFLEX MICROSCOPIC (NOT AT [facility])  I-STAT TROPOININ, ED  TYPE AND SCREEN    Imaging Review Ct Head Wo Contrast  03/12/2015  CLINICAL DATA:  Continued surveillance intraparenchymal hemorrhage. Alteration of mental status, with apparent decline in neuro exam. EXAM: CT HEAD WITHOUT CONTRAST TECHNIQUE: Contiguous axial images were obtained from the base of the skull through the vertex without intravenous contrast. COMPARISON:  Multiple priors, most recent earlier today at [facility] at 4:30 p.m. FINDINGS: Stable intraparenchymal hemorrhage involving the LEFT temporal lobe with no significant increase in size compared with the earlier scan. Cross-sectional measurements are 2.4 x 3.5 cm, extending over a 2.5 cm craniocaudal extent, corresponding to an approximate volume of 10.5 mL. Mild surrounding edema without significant midline shift or uncal herniation. No significant subarachnoid, subdural, or intraventricular extension. Mild cerebral and cerebellar atrophy. Moderate hypoattenuation of white matter suggesting chronic microvascular ischemic change. Calvarium is intact. Minor fluid fills the LEFT ethmoid air cell. No mastoid or  middle ear fluid. Proximal vascular calcification. IMPRESSION: Stable intraparenchymal hemorrhage involving the LEFT temporal lobe with no significant increase in size from earlier CT. 2.4 x 3.5 x 2.5 cm dimensions correspond to an approximate volume of 10.5 mL. Electronically Signed   By: Elsie Stain M.D.   On: 03/12/2015 19:20   Ct Head Wo Contrast  03/12/2015  CLINICAL DATA:  Evaluate for intracranial hemorrhage. Sudden onset of confusion. EXAM: CT HEAD WITHOUT CONTRAST TECHNIQUE: Contiguous axial images were obtained from the base of the skull through the vertex without intravenous contrast. COMPARISON:  MR brain 04/08/2013 FINDINGS: There is a 3.6 x 2.5 cm acute intraparenchymal hemorrhage within the left temporoparietal lobe (image 13; series 2). There is mild mass effect on the adjacent left lateral ventricle. Ventricles and sulci are appropriate for patient's age. Periventricular and subcortical white matter hypodensity, compatible with chronic small vessel ischemic change. Orbits are unremarkable. Mucosal thickening within the left maxillary sinus and ethmoid air cells. Mastoid air cells are unremarkable. Calvarium is intact. IMPRESSION: Acute intraparenchymal hemorrhage within the left temporoparietal lobe measuring up to 3.6 cm. There is mild mass effect on the adjacent left lateral ventricle. This is an atypical location and may be secondary to underlying vasculitis, hemorrhagic venous infarct or hemorrhagic metastasis. This needs dedicated evaluation  with MRI. Critical Value/emergent results were called by telephone at the time of interpretation on 03/12/2015 at 4:49 pm to Dr. Marily Memos , who verbally acknowledged these results. Electronically Signed   By: Annia Belt M.D.   On: 03/12/2015 16:57   I have personally reviewed and evaluated these images and lab results as part of my medical decision-making.   EKG Interpretation   Date/Time:  Sunday March 12 2015 17:00:33 EST Ventricular  Rate:  93 PR Interval:  196 QRS Duration: 102 QT Interval:  371 QTC Calculation: 461 R Axis:   49 Text Interpretation:  Sinus rhythm RSR' in V1 or V2, probably normal  variant slight st depressions in II, V4, V5 and elevation in aVR Confirmed  by [facility] MD, Zanyah Cower [phone]) on 03/12/2015 8:11:46 PM      MDM   Final diagnoses:  Nontraumatic cortical hemorrhage of left cerebral hemisphere [facility])   69 year old female presents to the emergency department secondary to acute onset of aphasia proximally new today while walking with her daughter. No other neurologic symptoms however they aphasia worsened to the point now shoelaces yes or thank you. She follows commands with her returning to all follow commands or lower extremity. No obvious extraocular movement changes. Protecting her airway and is fully alert just not able to answer questions. Initial differential diagnosis was concerning for stroke, ischemic versus hemorrhagic so she was rushed to the CT scanner where she was found to have a significant left sized left intraparenchymal hemorrhage that was the likely cause of her symptoms. She is hypertensive so started on labetalol drip. I discussed the case with neurosurgery at [facility] who didn't feel she is a neurosurgical candidate at this time. I then consulted neurology who recommended the patient be transferred from ED to ED in order evaluate her fully prior to admission. Patient still protecting her airway, care link was not available so patient was sent with [facility] to get her there for definitive care as quickly as possible.      Marily Memos, MD 03/12/15 6045  Marily Memos, MD 03/12/15 [phone]

## 2015-03-12 NOTE — ED Notes (Signed)
Dr Venetia Maxonstern at the bedside

## 2015-03-12 NOTE — Progress Notes (Signed)
eLink Physician-Brief Progress Note Patient Name: Shawna FlockBarbara C Banks DOB: Jul 02, 1945 MRN: [id number]   Date of Service  03/12/2015  HPI/Events of Note    eICU Interventions  PAD protocol - propofol gtt / fent prn     Intervention Category Major Interventions: Delirium, psychosis, severe agitation - evaluation and management  ALVA,RAKESH V. 03/12/2015, 9:41 PM

## 2015-03-12 NOTE — ED Notes (Signed)
Family at the bedside.  Neurologists at the bedside.  Orders given   For meds.  Labetalol drip discd by neuro.  She has been to c-t and returned

## 2015-03-12 NOTE — ED Notes (Signed)
EMS at bedside

## 2015-03-12 NOTE — ED Notes (Signed)
Pt's BP dropped to 75/65. Labetalol stopped. Increased rate of fluids. Rechecked BP at 1748, BP at 177/89. Restarted labetalol at 1mg /min.

## 2015-03-12 NOTE — Progress Notes (Signed)
CODE STROKE.  Called @ 1655 after the CT scan was done. Code Stroke Beeper was activated.  [facility]. Phone call @ 16:30 for possible code stroke. Physician had no seen patient. Pt on table @ 16:40  Pt scanned under code Stroke protocol in case Neurology was needed. Scan complete @ 16:45  [facility] Spoke with Bjorn LoserRhonda to report possible code stroke - Pt elevated to code stroke level to be dictated.

## 2015-03-12 NOTE — ED Notes (Addendum)
Pt now aphasic and becoming more agitated. Pt neglecting RUE more than previous assessment. MD Mesner aware and is at bedside. Mild drooling noted to pt's RT side.

## 2015-03-12 NOTE — H&P (Addendum)
NEURO HOSPITALIST CONSULT NOTE   Reason for Admission: Left parietal acute Intracerebral hemorrhage  HPI:                                                                                                                                          Shawna Banks is an 69 y.o. female patient who presented to an outside ER with symptoms of speech problems, headache earlier this evening. CT of the brain done at outside ER showed a left parietal intracerebral hemorrhage. I spoke to the ER physician on phone. She appeared to have partial aphasia but had sustained antigravity strength in bilateral upper extremities, symmetric.   After she arrived in our ER, she was noted to have worsening mental status. During the initial assessment in the triage, she is unable to cooperate with any neurological examination, unable to sustain antigravity strength.   Past Medical History  Diagnosis Date  . Hypertension   . Hypercholesteremia   . Dementia     History reviewed. No pertinent past surgical history.  History reviewed. No pertinent family history.  Family History: Unable to obtain    Social History:  reports that she has never smoked. She does not have any smokeless tobacco history on file. She reports that she does not drink alcohol. Her drug history is not on file. Per EMR.   No Known Allergies  MEDICATIONS:                                                                                                                     Scheduled: .  stroke: mapping our early stages of recovery book   Does not apply Once  . labetalol      . pantoprazole (PROTONIX) IV  40 mg Intravenous QHS  . senna-docusate  1 tablet Oral BID   Continuous: . fosPHENYtoin (CEREBYX) IV 1,134 mg PE (03/12/15 1928)  . labetalol (NORMODYNE) infusion 2 mg/min (03/12/15 1752)   ZOX:[id number] **OR** acetaminophen, labetalol   Current facility-administered medications:  .   stroke: mapping our early  stages of recovery book, , Does not apply, Once, Ramond Marrow, DO .  acetaminophen (TYLENOL) tablet 650 mg, 650 mg, Oral, Q4H PRN **OR** acetaminophen (TYLENOL) suppository 650 mg, 650 mg, Rectal, Q4H PRN, Ramond Marrow, DO .  fosPHENYtoin (CEREBYX) 1,134 mg PE in sodium chloride 0.9 % 50 mL IVPB, 20 mg PE/kg, Intravenous, Once, Graiden Henes Daniel NonesNarayan Kaveer Tilden Broz, MD, Last Rate: 436.1 mL/hr at 03/12/15 1928, 1,134 mg PE at 03/12/15 1928 .  labetalol (NORMODYNE,TRANDATE) 5 MG/ML injection, , , ,  .  labetalol (NORMODYNE,TRANDATE) 500 mg in dextrose 5 % 125 mL (4 mg/mL) infusion, 0.5-3 mg/min, Intravenous, Titrated, Marily MemosJason Mesner, MD, Last Rate: 30 mL/hr at 03/12/15 1752, 2 mg/min at 03/12/15 1752 .  labetalol (NORMODYNE,TRANDATE) injection 10-40 mg, 10-40 mg, Intravenous, Q10 min PRN, Ramond MarrowPeter J Sumner, DO .  pantoprazole (PROTONIX) injection 40 mg, 40 mg, Intravenous, QHS, Ramond MarrowPeter J Sumner, DO .  senna-docusate (Senokot-S) tablet 1 tablet, 1 tablet, Oral, BID, Ramond MarrowPeter J Sumner, DO  Current outpatient prescriptions:  .  donepezil (ARICEPT) 10 MG tablet, Take 10 mg by mouth at bedtime., Disp: , Rfl:  .  enalapril (VASOTEC) 20 MG tablet, Take 20 mg by mouth daily., Disp: , Rfl:  .  simvastatin (ZOCOR) 40 MG tablet, Take 40 mg by mouth at bedtime. , Disp: , Rfl:  .  Vitamin D, Ergocalciferol, (DRISDOL) 50000 UNITS CAPS capsule, Take 50,000 Units by mouth every 14 (fourteen) days. Every other Wednesday, Disp: , Rfl:  .  oxyCODONE-acetaminophen (PERCOCET) 5-325 MG per tablet, Take 1 tablet by mouth every 4 (four) hours as needed. (Patient not taking: Reported on 03/12/2015), Disp: 60 tablet, Rfl: 0  ROS:                                                                                                                                       History obtained from unobtainable from patient due to mental status   Blood pressure 144/84, pulse 88, temperature 98.5 F (36.9 C), temperature source Oral, resp. rate 22, height  5\' 3"  (1.6 m), weight 56.7 kg (125 lb), SpO2 99 %.   Neurologic Examination:                                                                                                      HEENT-  Normocephalic, no lesions, without obvious abnormality.  Normal external eye and conjunctiva. Normal  external ears.   Neurological Examination Very drowsy, has a fixed right gaze deviation does not follow commands. Pupils equal and reactive , nonverbal Facial grimace appears symmetric no abnormal twitching or involuntary movements noted Motor tone symmetric no voluntary motor movement noted. Minimal withdrawal with stimulus in all limbs.  Deep tendon reflexes symmetric, +Babinski on the  right.   Lab Results: Basic Metabolic Panel:  Recent Labs Lab 03/12/15 1655 03/12/15 1659  NA 141 144  K 2.7* 2.8*  CL 106 102  CO2 28  --   GLUCOSE 129* 125*  BUN 13 12  CREATININE 0.80 0.80  CALCIUM 8.5*  --     Liver Function Tests:  Recent Labs Lab 03/12/15 1655  AST 19  ALT 11*  ALKPHOS 76  BILITOT 0.2*  PROT 7.0  ALBUMIN 4.2   No results for input(s): LIPASE, AMYLASE in the last 168 hours. No results for input(s): AMMONIA in the last 168 hours.  CBC:  Recent Labs Lab 03/12/15 1655 03/12/15 1659  WBC 6.7  --   NEUTROABS 5.3  --   HGB 13.7 14.3  HCT 41.0 42.0  MCV 92.8  --   PLT 153  --     Cardiac Enzymes: No results for input(s): CKTOTAL, CKMB, CKMBINDEX, TROPONINI in the last 168 hours.  Lipid Panel: No results for input(s): CHOL, TRIG, HDL, CHOLHDL, VLDL, LDLCALC in the last 168 hours.  CBG: No results for input(s): GLUCAP in the last 168 hours.  Microbiology: No results found for this or any previous visit.  Coagulation Studies:  Recent Labs  03/12/15 1655  LABPROT 14.8  INR 1.14    Imaging: Ct Head Wo Contrast  03/12/2015  CLINICAL DATA:  Evaluate for intracranial hemorrhage. Sudden onset of confusion. EXAM: CT HEAD WITHOUT CONTRAST TECHNIQUE: Contiguous axial  images were obtained from the base of the skull through the vertex without intravenous contrast. COMPARISON:  MR brain 04/08/2013 FINDINGS: There is a 3.6 x 2.5 cm acute intraparenchymal hemorrhage within the left temporoparietal lobe (image 13; series 2). There is mild mass effect on the adjacent left lateral ventricle. Ventricles and sulci are appropriate for patient's age. Periventricular and subcortical white matter hypodensity, compatible with chronic small vessel ischemic change. Orbits are unremarkable. Mucosal thickening within the left maxillary sinus and ethmoid air cells. Mastoid air cells are unremarkable. Calvarium is intact. IMPRESSION: Acute intraparenchymal hemorrhage within the left temporoparietal lobe measuring up to 3.6 cm. There is mild mass effect on the adjacent left lateral ventricle. This is an atypical location and may be secondary to underlying vasculitis, hemorrhagic venous infarct or hemorrhagic metastasis. This needs dedicated evaluation with MRI. Critical Value/emergent results were called by telephone at the time of interpretation on 03/12/2015 at 4:49 pm to Dr. Marily Memos , who verbally acknowledged these results. Electronically Signed   By: Annia Belt M.D.   On: 03/12/2015 16:57      Assessment/Plan: 69 year old female patient with a left parietal intracerebral hemorrhage transferred from the outside ER. She was noted to have worsening altered mental status with fixed right gaze deviation but arrival in the ER. The remaining examination she continued to have right gaze deviation, not following commands, nonverbal. 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 A stat CT of the brain was performed which showed mild increase in size of the left parietal hemorrhage as it continues to be evidence of possible active bleeding within the  hemorrhage based on different signal changes in the hemorrhage. No other new hemorrhage elsewhere in the parenchyma is seen.  For management of nonconvulsive status epilepticus, recommend stat one dose of Ativan 1 mg, stat IV fosphenytoin 20 mg/kg. She'll be admitted to the ICU for further management. She is maintaining  her airway. Her blood pressure was elevated initially but has been controlled with labetalol, and currently with systolics of 120. The goal systolic blood pressure would be between 120-140 to decrease her risk of hemorrhage expansion. Recommend  a CT angiogram of the head to rule out aneurysm. Recommend maintenance IV fosphenytoin 100 mg/kg starting early tomorrow morning, and EEG.  If she continues to have fixed right gaze deviation may need to add additional seizure medications. Dr. Elspeth Cho, neurologist, will continue to follow up overnight. Please call for any further questions.   I discussed her case with the neurosurgeon on-call Dr. Venetia Maxon, and he'll be seeing the patient in the ER.

## 2015-03-12 NOTE — ED Notes (Signed)
Report called  

## 2015-03-12 NOTE — Progress Notes (Addendum)
Assumed care of patient from daytime neurologist, Dr Lavon PaganiniNandigam. Upon evaluation at bedside patient noted to be unresponsive to voice or noxious stimuli. Eyes noted to be deviated to the right, unable to move past midline. No spontaneous movement of extremities, no tonic activity noted. Concern for possible seizure activity.  Gave ativan 1mg  x 1 with no improvement in gaze deviation. Patient then receiving loading dose of fosphenytoin ordered by Dr Lavon PaganiniNandigam. Patient re-evaluated after dose of fosphenytoin. Continues to be unresponsive but noted midline eyes and will weakly withdrawal to deep nail bed pressure in bilateral UE. Currently appears more consistent with a post-ictal state. Pressure noted to drop after dose of fosphenytoin. Will hold on further doses and start keppra for seizure prophylaxis.   Will be admitted to neuro-ICU and will continue to monitor closely. Consider stat EEG if subsequent episodes concerning for seizure activity. Repeat head CT stable, plan for stat CT if change in status.

## 2015-03-12 NOTE — ED Notes (Signed)
Neuro has just given her a  Stroke scale of 32

## 2015-03-12 NOTE — ED Notes (Signed)
AC Winnie notified for labetalol drip.

## 2015-03-12 NOTE — ED Notes (Signed)
noverbal response.  She is nauseated.  She momentarily moved her rt arm over to her lt  Arm and crossed her rt leg over there lt

## 2015-03-12 NOTE — ED Notes (Signed)
Port chest post intubatioin for tube placement

## 2015-03-12 NOTE — ED Provider Notes (Signed)
CSN: [id number]     Arrival date & time 03/12/15  1614 History   First MD Initiated Contact with Patient 03/12/15 1632     Chief Complaint  Patient presents with  . Code Stroke    An emergency department physician performed an initial assessment on this suspected stroke patient at 1632. (Consider location/radiation/quality/duration/timing/severity/associated sxs/prior Treatment) HPI  Patient is a 69 year old female with past medical history significant for hypertension, HLD, dementia, who presents presented to the outside hospital for difficulty with speech. Per report patient was with her family where she had a sudden onset of difficulty getting her words out. Patient complaining of headache during that time. Patient was taken to OSH where she was found to have a left parietal intraparenchymal hemorrhage with midline shift. Patient was alert and oriented during that time. Patient was transferred for further evaluation. After arrival, patient had decrease in mental status. Unresponsive to verbal or noxious stimulus.   Past Medical History  Diagnosis Date  . Hypertension   . Hypercholesteremia   . Dementia    History reviewed. No pertinent past surgical history. History reviewed. No pertinent family history. Social History  Substance Use Topics  . Smoking status: Never Smoker   . Smokeless tobacco: None  . Alcohol Use: No   OB History    No data available     Review of Systems  Unable to perform ROS: Mental status change      Allergies  Review of patient's allergies indicates no known allergies.  Home Medications   Prior to Admission medications   Medication Sig Start Date End Date Taking? Authorizing Provider  Cholecalciferol (VITAMIN D-3) 1000 UNITS CAPS Take 1 capsule by mouth daily.    Historical Provider, MD  donepezil (ARICEPT) 10 MG tablet Take 10 mg by mouth at bedtime.    Historical Provider, MD  enalapril (VASOTEC) 20 MG tablet Take 20 mg by mouth daily.     Historical Provider, MD  oxyCODONE-acetaminophen (PERCOCET) 5-325 MG per tablet Take 1 tablet by mouth every 4 (four) hours as needed. 02/28/14   Vickki Hearing, MD  simvastatin (ZOCOR) 40 MG tablet Take 40 mg by mouth daily.    Historical Provider, MD  Vitamin D, Ergocalciferol, (DRISDOL) 50000 UNITS CAPS capsule Take 50,000 Units by mouth every 7 (seven) days.    Historical Provider, MD   BP 208/91 mmHg  Pulse 104  Temp(Src) 98.5 F (36.9 C) (Oral)  Resp 16  Ht  (1.6 m)  Wt 125 lb (56.7 kg)  BMI 22.15 kg/m2  SpO2 100% Physical Exam  Constitutional: She appears well-developed and well-nourished. She appears lethargic.  HENT:  Head: Atraumatic.  Mouth/Throat: Oropharynx is clear and moist.  Eyes: Conjunctivae and EOM are normal. Pupils are equal, round, and reactive to light.  2 mm equal round and reactive  Neck: Normal range of motion.  Cardiovascular: Normal rate, regular rhythm, normal heart sounds and intact distal pulses.   Pulmonary/Chest: Effort normal and breath sounds normal. No respiratory distress.  Abdominal: Soft. She exhibits no distension. There is no tenderness.  Musculoskeletal: Normal range of motion.  Neurological: She appears lethargic. GCS eye subscore is 2. GCS verbal subscore is 1. GCS motor subscore is 4. She displays Babinski's sign on the right side.  Drowsy. Fixed rightward gaze deviation, no nystagmus. Nonverbal, unable to follow commands. Face appears symmetric. Normal tone throughout. Strenth 0/5 RLU and RUE. 1/5 LUE and LLE. Minimally withdraws to noxious stimulus in all limbs. No involuntary movement  noted.   Skin: Skin is warm. No pallor.  Psychiatric: She has a normal mood and affect.  Nursing note and vitals reviewed.    ED Course  .Intubation Date/Time: 03/12/2015 10:11 PM Performed by: Corena Herter Authorized by: Corena Herter Consent: The procedure was performed in an emergent situation. Required items: required blood products,  implants, devices, and special equipment available Patient identity confirmed: verbally with patient, arm band and hospital-assigned identification number Indications: airway protection Intubation method: direct Patient status: paralyzed (RSI) Preoxygenation: nonrebreather mask Sedatives: etomidate Paralytic: rocuronium Laryngoscope size: Mac 3 Tube size: 7.5 mm Tube type: cuffed Number of attempts: 1 Cricoid pressure: yes Cords visualized: yes Post-procedure assessment: chest rise,  ETCO2 monitor and CO2 detector Breath sounds: equal and absent over the epigastrium Cuff inflated: yes ETT to lip: 24 cm Tube secured with: ETT holder Chest x-ray interpreted by me and radiologist. Chest x-ray findings: endotracheal tube in appropriate position Patient tolerance: Patient tolerated the procedure well with no immediate complications   (including critical care time) Labs Review Labs Reviewed  COMPREHENSIVE METABOLIC PANEL - Abnormal; Notable for the following:    Potassium 2.7 (*)    Glucose, Bld 129 (*)    Calcium 8.5 (*)    ALT 11 (*)    Total Bilirubin 0.2 (*)    All other components within normal limits  I-STAT CHEM 8, ED - Abnormal; Notable for the following:    Potassium 2.8 (*)    Glucose, Bld 125 (*)    All other components within normal limits  ETHANOL  PROTIME-INR  APTT  CBC  DIFFERENTIAL  URINE RAPID DRUG SCREEN, HOSP PERFORMED  URINALYSIS, ROUTINE W REFLEX MICROSCOPIC (NOT AT [facility])  I-STAT TROPOININ, ED  TYPE AND SCREEN    Imaging Review Ct Head Wo Contrast  03/12/2015  CLINICAL DATA:  Evaluate for intracranial hemorrhage. Sudden onset of confusion. EXAM: CT HEAD WITHOUT CONTRAST TECHNIQUE: Contiguous axial images were obtained from the base of the skull through the vertex without intravenous contrast. COMPARISON:  MR brain 04/08/2013 FINDINGS: There is a 3.6 x 2.5 cm acute intraparenchymal hemorrhage within the left temporoparietal lobe (image 13; series 2).  There is mild mass effect on the adjacent left lateral ventricle. Ventricles and sulci are appropriate for patient's age. Periventricular and subcortical white matter hypodensity, compatible with chronic small vessel ischemic change. Orbits are unremarkable. Mucosal thickening within the left maxillary sinus and ethmoid air cells. Mastoid air cells are unremarkable. Calvarium is intact. IMPRESSION: Acute intraparenchymal hemorrhage within the left temporoparietal lobe measuring up to 3.6 cm. There is mild mass effect on the adjacent left lateral ventricle. This is an atypical location and may be secondary to underlying vasculitis, hemorrhagic venous infarct or hemorrhagic metastasis. This needs dedicated evaluation with MRI. Critical Value/emergent results were called by telephone at the time of interpretation on 03/12/2015 at 4:49 pm to Dr. Marily Memos , who verbally acknowledged these results. Electronically Signed   By: Annia Belt M.D.   On: 03/12/2015 16:57   I have personally reviewed and evaluated these images and lab results as part of my medical decision-making.   EKG Interpretation None      MDM   Final diagnoses:  None    Patient is a 69 year old female who presented from an outside hospital for a code stroke. OSH imaging showed IPH in the left temporoparietal lobe with mild mass effect. Reported to have NIH scale of 9 prior to arrival. On arrival, patient with significant decline of mental  status with fixed right gaze deviation. Afebrile. BP 140s/80s on labetalol gtt, started at OSH. GCS 7. Maintaining her airway. No anticoagulation, no reversal needed.   Given patient's significant decline of mental status, stat CT head was performed to look for progression of intraparenchymal mole hemorrhage. Showed mild increase in size of left parietal hemorrhage. No progression of midline shift. Patient's care was discussed with neurosurgery prior to arrival, recommended to no acute intervention.  Neurology was consulted upon arrival of the patient. He evaluated the patient in the ED. Recommended Ativan 1 mg, fosphenytoin 20 mg/kg for likely partial complex seizure.   Patient without significant improvement of mental status following treatment. Patient intubated for airway protection, please see procedure note for detail. Following treatment with ativan, decrease in BP to 80s/60s (MAP 65). Labetalol discontinued, given 500 mL bolus with resolution of hypotension.   Neurology recommended admission to neuro ICU. We'll obtain a CT angiogram and EEG tomorrow. Patient transferred to the ICU in stable condition.  Corena HerterShannon Nhyla Nappi, MD 03/12/15 2322  Blane OharaJoshua Zavitz, MD 03/12/15 [phone]

## 2015-03-12 NOTE — Consult Note (Signed)
PULMONARY / CRITICAL CARE MEDICINE   Name: RANDAL YEPIZ MRN: [id number] DOB: [birth year]    ADMISSION DATE:  03/12/2015 CONSULTATION DATE:  03/12/2015  REFERRING MD :  Dr. Hosie Poisson  CHIEF COMPLAINT:  Hemorrhagic ICH, intubated  HISTORY OF PRESENT ILLNESS:   Patient is intubated and minimally responsive, no family available, HPI obtained from physician/nursing notes.  69 y.o. female patient who presented to an outside ER with symptoms of slurred speach, [place].  She was taken to [facility] hospital and found to have Left parietal lobe IPH on CT of the brain . Neurology spoke to OSH physician who states she had partial aphasia but had sustained antigravity strength in bilateral upper extremities, symmetric. Upon arrival to [facility], she was noted to have worsening mental status. During the initial assessment in the triage, she is unable to cooperate with any neurological examination, unable to sustain antigravity strength.   In the ED at [facility] shearived with worsening mental status, CT head demonstrated no significant enlargement of IPH.  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  She was loaded with dilantin and given ativan.  She was intubated in the ED.  PCCM is consulted for vent management   PAST MEDICAL HISTORY :   has a past medical history of Hypertension; Hypercholesteremia; and Dementia.  has no past surgical history on file. Prior to Admission medications   Medication Sig Start Date End Date Taking? Authorizing Provider  donepezil (ARICEPT) 10 MG tablet Take 10 mg by mouth at bedtime.   Yes Historical Provider, MD  enalapril (VASOTEC) 20 MG tablet Take 20 mg by mouth daily.   Yes Historical Provider, MD  simvastatin (ZOCOR) 40 MG tablet Take 40 mg by mouth at bedtime.    Yes Historical Provider, MD  Vitamin D, Ergocalciferol, (DRISDOL) 50000 UNITS CAPS  capsule Take 50,000 Units by mouth every 14 (fourteen) days. Every other Wednesday   Yes Historical Provider, MD  oxyCODONE-acetaminophen (PERCOCET) 5-325 MG per tablet Take 1 tablet by mouth every 4 (four) hours as needed. Patient not taking: Reported on 03/12/2015 02/28/14   Vickki Hearing, MD   No Known Allergies  FAMILY HISTORY:  has no family status information on file.  SOCIAL HISTORY:  reports that she has never smoked. She does not have any smokeless tobacco history on file. She reports that she does not drink alcohol.  REVIEW OF SYSTEMS:   Unable to obtain, patient is intubated and non responsive.   SUBJECTIVE:   VITAL SIGNS: Temp:  [97.5 F (36.4 C)-98.5 F (36.9 C)] 98.2 F (36.8 C) (11/20 2200) Pulse Rate:  [60-104] 64 (11/20 2200) Resp:  [13-22] 14 (11/20 2200) BP: (71-214)/(46-113) 104/56 mmHg (11/20 2200) SpO2:  [91 %-100 %] 95 % (11/20 2200) FiO2 (%):  [40 %-100 %] 40 % (11/20 2200) Weight:  [56.7 kg (125 lb)-58.2 kg (128 lb 4.9 oz)] 58.2 kg (128 lb 4.9 oz) (11/20 2104) HEMODYNAMICS:   VENTILATOR SETTINGS: Vent Mode:  [-] PRVC FiO2 (%):  [40 %-100 %] 40 % Set Rate:  [14 bmp] 14 bmp Vt Set:  [500 mL] 500 mL PEEP:  [5 cmH20] 5 cmH20 Plateau Pressure:  [15 cmH20] 15 cmH20 INTAKE / OUTPUT: No intake or output data in the 24 hours ending 03/12/15 2210  PHYSICAL EXAMINATION: General:  Intubated, not sedated. Not arousable.  Neuro:  ** patient received anesthesia and NMB 2 hours prior to  exam** - Distal b/l LE: with draws minimally to pain - Distal b/l UE: no withdrawal to pain distally.  Grimace to pain with proximal stimulation. - Grimace to trap squeeze b/l - + Gag - + cough - minimal facial movement spontaneously, either swallowing or rooting reflex - Pupils 3mm b/l and reactive to light.  HEENT:  ETT in place. further exam as above. Cardiovascular:  Bradycardic, no m/r/g Lungs:  CTA b/l with mechanical Breath sounds. No w/r/r Abdomen:   Soft non  tender, normal bowel sounds  Musculoskeletal:  Normal bulk and tone Skin:  No obvious rashes  LABS:  CBC  Recent Labs Lab 03/12/15 1655 03/12/15 1659  WBC 6.7  --   HGB 13.7 14.3  HCT 41.0 42.0  PLT 153  --    Coag's  Recent Labs Lab 03/12/15 1655  APTT 27  INR 1.14   BMET  Recent Labs Lab 03/12/15 1655 03/12/15 1659  NA 141 144  K 2.7* 2.8*  CL 106 102  CO2 28  --   BUN 13 12  CREATININE 0.80 0.80  GLUCOSE 129* 125*   Electrolytes  Recent Labs Lab 03/12/15 1655  CALCIUM 8.5*   Sepsis Markers No results for input(s): LATICACIDVEN, PROCALCITON, O2SATVEN in the last 168 hours. ABG  Recent Labs Lab 03/12/15 2110  PHART 7.399  PCO2ART 38.3  PO2ART 420*   Liver Enzymes  Recent Labs Lab 03/12/15 1655  AST 19  ALT 11*  ALKPHOS 76  BILITOT 0.2*  ALBUMIN 4.2   Cardiac Enzymes No results for input(s): TROPONINI, PROBNP in the last 168 hours. Glucose No results for input(s): GLUCAP in the last 168 hours.  Imaging Ct Head Wo Contrast  03/12/2015  CLINICAL DATA:  Continued surveillance intraparenchymal hemorrhage. Alteration of mental status, with apparent decline in neuro exam. EXAM: CT HEAD WITHOUT CONTRAST TECHNIQUE: Contiguous axial images were obtained from the base of the skull through the vertex without intravenous contrast. COMPARISON:  Multiple priors, most recent earlier [facility] at [facility] at 4:30 p.m. FINDINGS: Stable intraparenchymal hemorrhage involving the LEFT temporal lobe with no significant increase in size compared with the earlier scan. Cross-sectional measurements are 2.4 x 3.5 cm, extending over a 2.5 cm craniocaudal extent, corresponding to an approximate volume of 10.5 mL. Mild surrounding edema without significant midline shift or uncal herniation. No significant subarachnoid, subdural, or intraventricular extension. Mild cerebral and cerebellar atrophy. Moderate hypoattenuation of white matter suggesting chronic  microvascular ischemic change. Calvarium is intact. Minor fluid fills the LEFT ethmoid air cell. No mastoid or middle ear fluid. Proximal vascular calcification. IMPRESSION: Stable intraparenchymal hemorrhage involving the LEFT temporal lobe with no significant increase in size from earlier CT. 2.4 x 3.5 x 2.5 cm dimensions correspond to an approximate volume of 10.5 mL. Electronically Signed   By: Elsie StainJohn T Curnes M.D.   On: 03/12/2015 19:20   Ct Head Wo Contrast  03/12/2015  CLINICAL DATA:  Evaluate for intracranial hemorrhage. Sudden onset of confusion. EXAM: CT HEAD WITHOUT CONTRAST TECHNIQUE: Contiguous axial images were obtained from the base of the skull through the vertex without intravenous contrast. COMPARISON:  MR brain 04/08/2013 FINDINGS: There is a 3.6 x 2.5 cm acute intraparenchymal hemorrhage within the left temporoparietal lobe (image 13; series 2). There is mild mass effect on the adjacent left lateral ventricle. Ventricles and sulci are appropriate for patient's age. Periventricular and subcortical white matter hypodensity, compatible with chronic small vessel ischemic change. Orbits are unremarkable. Mucosal thickening within the left  maxillary sinus and ethmoid air cells. Mastoid air cells are unremarkable. Calvarium is intact. IMPRESSION: Acute intraparenchymal hemorrhage within the left temporoparietal lobe measuring up to 3.6 cm. There is mild mass effect on the adjacent left lateral ventricle. This is an atypical location and may be secondary to underlying vasculitis, hemorrhagic venous infarct or hemorrhagic metastasis. This needs dedicated evaluation with MRI. Critical Value/emergent results were called by telephone at the time of interpretation on 03/12/2015 at 4:49 pm to Dr. Marily Memos , who verbally acknowledged these results. Electronically Signed   By: Annia Belt M.D.   On: 03/12/2015 16:57   Dg Chest Portable 1 View  03/12/2015  CLINICAL DATA:  Intracranial hemorrhage.  Intubation. Initial encounter. EXAM: PORTABLE CHEST 1 VIEW COMPARISON:  Limited correlation made with left shoulder radiographs 03/22/2014. No prior chest radiographs. FINDINGS: 2018 hours. Tip of the endotracheal tube is 2.4 cm above the carina. The heart size and mediastinal contours are normal aside from mild aortic atherosclerosis. There is mild interstitial prominence of the lungs, but no edema, confluent airspace opacity or pleural effusion. There is no pneumothorax. Posttraumatic deformity of the proximal left humerus noted. IMPRESSION: Endotracheal tube tip is in the lower trachea, 2.4 cm above the carina. No acute cardiopulmonary process suspected. Electronically Signed   By: Carey Bullocks M.D.   On: 03/12/2015 20:45     ASSESSMENT / PLAN: 41F with left parietal lobe IPH concern for HTN CVA vs Cerebral amyloid angiopathy, vs other and resultant partial complex seizures and non convulsive status epilepticus.  Currently intubated for airway protection given poor mental status.   PULMONARY -intubated with IPPV  P:   - ETT in appropriate position - ABG demonstrates current ventilator settings are adequate for ventilation - Decrease PEEP/FiO2 to maintain pulse oximetry >88% - maintain secure airway for now until mental status improves - PPI for GI PPx - dexmetatomidine or propofol would be reasonable choices for sedation  CARDIOVASCULAR - HTN A: Unclear if current hypertension is 2/2 or primary cause of IPH.   P:  - given bradycardia - nicardipine gtt was initiated in the ICU - goal SBP per neurology 120-140 - If tachycardia develops or bradycardia improves labetalol would be a reasonable alternative.   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 - Keppra 1G BID per neurology - holding sedation for now to evaluate neurologic status - Maintain  airway protection - SBP 120-140 - LTM/ EEG - further management per neurology team - dexmetatomidine or propofol would be reasonable choices for sedation   Total critical care time: 45 min  Critical care time was exclusive of separately billable procedures and treating other patients.  Critical care was necessary to treat or prevent imminent or life-threatening deterioration.  Critical care was time spent personally by me on the following activities: development of treatment plan with patient and/or surrogate as well as nursing, discussions with consultants, evaluation of patient's response to treatment, examination of patient, obtaining history from patient or surrogate, ordering and performing treatments and interventions, ordering and review of laboratory studies, ordering and review of radiographic studies, pulse oximetry and re-evaluation of patient's condition.   Galvin Proffer, DO., MS [facility]    Pulmonary and Critical Care Medicine [facility] Pager: [phone]  03/12/2015, 10:10 PM

## 2015-03-12 NOTE — Consult Note (Signed)
Reason for Consult:ICH Referring Physician: Jeanie Mccard is an 69 y.o. female.  HPI: Patient presented to [facility] with AMS and aphasia and head CT obtained which shows left posterior temporo-parietal ICH with minimal shift or mass effect.  Patient at baseline has early dementia. On arrival at [facility], patient had gaze deviation and decline in MS.  Head CT repeated which shows layering of blood products and minimal increase in size of hematoma.  Past Medical History  Diagnosis Date  . Hypertension   . Hypercholesteremia   . Dementia     History reviewed. No pertinent past surgical history.  History reviewed. No pertinent family history.  Social History:  reports that she has never smoked. She does not have any smokeless tobacco history on file. She reports that she does not drink alcohol. Her drug history is not on file.  Allergies: No Known Allergies  Medications: I have reviewed the patient's current medications.  Results for orders placed or performed during the hospital encounter of 03/12/15 (from the past 48 hour(s))  Ethanol     Status: None   Collection Time: 03/12/15  4:55 PM  Result Value Ref Range   Alcohol, Ethyl (B) <5 <5 mg/dL    Comment:        LOWEST DETECTABLE LIMIT FOR SERUM ALCOHOL IS 5 mg/dL FOR MEDICAL PURPOSES ONLY   Protime-INR     Status: None   Collection Time: 03/12/15  4:55 PM  Result Value Ref Range   Prothrombin Time 14.8 11.6 - 15.2 seconds   INR 1.14 0.00 - 1.49  APTT     Status: None   Collection Time: 03/12/15  4:55 PM  Result Value Ref Range   aPTT 27 24 - 37 seconds  CBC     Status: None   Collection Time: 03/12/15  4:55 PM  Result Value Ref Range   WBC 6.7 4.0 - 10.5 K/uL   RBC 4.42 3.87 - 5.11 MIL/uL   Hemoglobin 13.7 12.0 - 15.0 g/dL   HCT 41.0 36.0 - 46.0 %   MCV 92.8 78.0 - 100.0 fL   MCH 31.0 26.0 - 34.0 pg   MCHC 33.4 30.0 - 36.0 g/dL   RDW 14.2 11.5 - 15.5 %   Platelets 153 150 - 400 K/uL  Differential     Status:  None   Collection Time: 03/12/15  4:55 PM  Result Value Ref Range   Neutrophils Relative % 79 %   Neutro Abs 5.3 1.7 - 7.7 K/uL   Lymphocytes Relative 14 %   Lymphs Abs 1.0 0.7 - 4.0 K/uL   Monocytes Relative 6 %   Monocytes Absolute 0.4 0.1 - 1.0 K/uL   Eosinophils Relative 1 %   Eosinophils Absolute 0.1 0.0 - 0.7 K/uL   Basophils Relative 0 %   Basophils Absolute 0.0 0.0 - 0.1 K/uL  Comprehensive metabolic panel     Status: Abnormal   Collection Time: 03/12/15  4:55 PM  Result Value Ref Range   Sodium 141 135 - 145 mmol/L   Potassium 2.7 (LL) 3.5 - 5.1 mmol/L    Comment: CRITICAL RESULT CALLED TO, READ BACK BY AND VERIFIED WITH: BOGLER,T AT 1725 ON 03/12/15 BY MOSLEY,J    Chloride 106 101 - 111 mmol/L   CO2 28 22 - 32 mmol/L   Glucose, Bld 129 (H) 65 - 99 mg/dL   BUN 13 6 - 20 mg/dL   Creatinine, Ser 0.80 0.44 - 1.00 mg/dL   Calcium 8.5 (  L) 8.9 - 10.3 mg/dL   Total Protein 7.0 6.5 - 8.1 g/dL   Albumin 4.2 3.5 - 5.0 g/dL   AST 19 15 - 41 U/L   ALT 11 (L) 14 - 54 U/L   Alkaline Phosphatase 76 38 - 126 U/L   Total Bilirubin 0.2 (L) 0.3 - 1.2 mg/dL   GFR calc non Af Amer >60 >60 mL/min   GFR calc Af Amer >60 >60 mL/min    Comment: (NOTE) The eGFR has been calculated using the CKD EPI equation. This calculation has not been validated in all clinical situations. eGFR's persistently <60 mL/min signify possible Chronic Kidney Disease.    Anion gap 7 5 - 15  Type and screen [facility]     Status: None   Collection Time: 03/12/15  4:55 PM  Result Value Ref Range   ABO/RH(D) A POS    Antibody Screen NEG    Sample Expiration 03/15/2015   I-stat troponin, ED (not at [facility], [facility])     Status: None   Collection Time: 03/12/15  4:58 PM  Result Value Ref Range   Troponin i, poc 0.00 0.00 - 0.08 ng/mL   Comment 3            Comment: Due to the release kinetics of cTnI, a negative result within the first hours of the onset of symptoms does not rule out myocardial  infarction with certainty. If myocardial infarction is still suspected, repeat the test at appropriate intervals.   I-Stat Chem 8, ED  (not at [facility], [facility])     Status: Abnormal   Collection Time: 03/12/15  4:59 PM  Result Value Ref Range   Sodium 144 135 - 145 mmol/L   Potassium 2.8 (L) 3.5 - 5.1 mmol/L   Chloride 102 101 - 111 mmol/L   BUN 12 6 - 20 mg/dL   Creatinine, Ser 0.80 0.44 - 1.00 mg/dL   Glucose, Bld 125 (H) 65 - 99 mg/dL   Calcium, Ion 1.13 1.13 - 1.30 mmol/L   TCO2 27 0 - 100 mmol/L   Hemoglobin 14.3 12.0 - 15.0 g/dL   HCT 42.0 36.0 - 46.0 %  Urinalysis, Routine w reflex microscopic (not at [facility])     Status: Abnormal   Collection Time: 03/12/15  7:47 PM  Result Value Ref Range   Color, Urine YELLOW YELLOW   APPearance CLEAR CLEAR   Specific Gravity, Urine 1.014 1.005 - 1.030   pH 6.5 5.0 - 8.0   Glucose, UA NEGATIVE NEGATIVE mg/dL   Hgb urine dipstick TRACE (A) NEGATIVE   Bilirubin Urine NEGATIVE NEGATIVE   Ketones, ur NEGATIVE NEGATIVE mg/dL   Protein, ur NEGATIVE NEGATIVE mg/dL   Nitrite NEGATIVE NEGATIVE   Leukocytes, UA NEGATIVE NEGATIVE  Urine microscopic-add on     Status: Abnormal   Collection Time: 03/12/15  7:47 PM  Result Value Ref Range   Squamous Epithelial / LPF 0-5 (A) NONE SEEN    Comment: Please note change in reference range.   WBC, UA 0-5 0 - 5 WBC/hpf    Comment: Please note change in reference range.   RBC / HPF 0-5 0 - 5 RBC/hpf    Comment: Please note change in reference range.   Bacteria, UA RARE (A) NONE SEEN    Comment: Please note change in reference range.   Urine-Other MUCOUS PRESENT     Ct Head Wo Contrast  03/12/2015  CLINICAL DATA:  Continued surveillance intraparenchymal hemorrhage. Alteration of mental status,  with apparent decline in neuro exam. EXAM: CT HEAD WITHOUT CONTRAST TECHNIQUE: Contiguous axial images were obtained from the base of the skull through the vertex without intravenous contrast. COMPARISON:  Multiple  priors, most recent earlier today at [facility] at 4:30 p.m. FINDINGS: Stable intraparenchymal hemorrhage involving the LEFT temporal lobe with no significant increase in size compared with the earlier scan. Cross-sectional measurements are 2.4 x 3.5 cm, extending over a 2.5 cm craniocaudal extent, corresponding to an approximate volume of 10.5 mL. Mild surrounding edema without significant midline shift or uncal herniation. No significant subarachnoid, subdural, or intraventricular extension. Mild cerebral and cerebellar atrophy. Moderate hypoattenuation of white matter suggesting chronic microvascular ischemic change. Calvarium is intact. Minor fluid fills the LEFT ethmoid air cell. No mastoid or middle ear fluid. Proximal vascular calcification. IMPRESSION: Stable intraparenchymal hemorrhage involving the LEFT temporal lobe with no significant increase in size from earlier CT. 2.4 x 3.5 x 2.5 cm dimensions correspond to an approximate volume of 10.5 mL. Electronically Signed   By: Staci Righter M.D.   On: 03/12/2015 19:20   Ct Head Wo Contrast  03/12/2015  CLINICAL DATA:  Evaluate for intracranial hemorrhage. Sudden onset of confusion. EXAM: CT HEAD WITHOUT CONTRAST TECHNIQUE: Contiguous axial images were obtained from the base of the skull through the vertex without intravenous contrast. COMPARISON:  MR brain 04/08/2013 FINDINGS: There is a 3.6 x 2.5 cm acute intraparenchymal hemorrhage within the left temporoparietal lobe (image 13; series 2). There is mild mass effect on the adjacent left lateral ventricle. Ventricles and sulci are appropriate for patient's age. Periventricular and subcortical white matter hypodensity, compatible with chronic small vessel ischemic change. Orbits are unremarkable. Mucosal thickening within the left maxillary sinus and ethmoid air cells. Mastoid air cells are unremarkable. Calvarium is intact. IMPRESSION: Acute intraparenchymal hemorrhage within the left  temporoparietal lobe measuring up to 3.6 cm. There is mild mass effect on the adjacent left lateral ventricle. This is an atypical location and may be secondary to underlying vasculitis, hemorrhagic venous infarct or hemorrhagic metastasis. This needs dedicated evaluation with MRI. Critical Value/emergent results were called by telephone at the time of interpretation on 03/12/2015 at 4:49 pm to Dr. Merrily Pew , who verbally acknowledged these results. Electronically Signed   By: Lovey Newcomer M.D.   On: 03/12/2015 16:57    Review of Systems - Negative except mental status change    Blood pressure 109/69, pulse 88, temperature 97.5 F (36.4 C), temperature source Oral, resp. rate 15, height '5\' 7"'  (1.702 m), weight 56.7 kg (125 lb), SpO2 100 %. Physical Exam  Constitutional: She appears well-developed and well-nourished.  HENT:  Head: Normocephalic and atraumatic.  Eyes: Conjunctivae are normal. Pupils are equal, round, and reactive to light.  Neck: Normal range of motion. Neck supple.  Neurological: She is unresponsive.  Patient recently sedated with ativan for presumed seizure.  Does withdraw 4 extremities to painful stimulus.  Not following commands.  Gaze midline.  Skin: Skin is warm, dry and intact.    Assessment/Plan: Patient has [place] with MS changes and aphasia.  Had decline in LOC, likely secondary to seizure.  There has not been significant change in ICH at this time.  I reviewed situation with Dr. Janann Colonel, patient's family.  Will hold off on surgical intervention at this time.  Peggyann Shoals, MD 03/12/2015, 8:35 PM

## 2015-03-12 NOTE — ED Notes (Signed)
Pt began dry heaving. Pt given 4 mg of Zofran by this RN en route.

## 2015-03-12 NOTE — ED Notes (Signed)
MD at bedside. Speaking with pt daughter.

## 2015-03-12 NOTE — ED Notes (Signed)
Pt intubated by the ed res

## 2015-03-12 NOTE — ED Notes (Signed)
pts bp just dropped  Neuro  Has rerquested a fluid bolus

## 2015-03-12 NOTE — ED Notes (Signed)
etomidat 20 mg  Roc 20mg  iv.

## 2015-03-12 NOTE — ED Notes (Signed)
Pt daughter reports altered mental status since noon today. Reports they were out shopping and pt began acting strange-more quiet than usual and not able to complete sentences.

## 2015-03-12 NOTE — ED Notes (Signed)
The pt is unable to follow commands she has failed her swallow screen

## 2015-03-12 NOTE — ED Notes (Signed)
Pt's sats dropped to 80%. Placed pt on 2L cannula with no improvement. Pt on 6L nasal cannula. Sats 96% at this time. Pt continues to be alert.

## 2015-03-12 NOTE — ED Notes (Signed)
Pt taken out to [facility] truck. Pt waiting on labetalol drip. This RN will [facility] EMS [facility].

## 2015-03-12 NOTE — ED Notes (Signed)
7.5 22 at lip

## 2015-03-12 NOTE — ED Notes (Signed)
The pt arrived by [facility] from [place].  There pt was sitting upright and turning her head as she came through the ed doors.  She then started to gaze to the rt

## 2015-03-13 ENCOUNTER — Inpatient Hospital Stay (HOSPITAL_COMMUNITY): Payer: Medicare Other

## 2015-03-13 DIAGNOSIS — R4182 Altered mental status, unspecified: Secondary | ICD-10-CM

## 2015-03-13 DIAGNOSIS — I6789 Other cerebrovascular disease: Secondary | ICD-10-CM

## 2015-03-13 DIAGNOSIS — I611 Nontraumatic intracerebral hemorrhage in hemisphere, cortical: Principal | ICD-10-CM

## 2015-03-13 DIAGNOSIS — J9601 Acute respiratory failure with hypoxia: Secondary | ICD-10-CM

## 2015-03-13 DIAGNOSIS — I680 Cerebral amyloid angiopathy: Secondary | ICD-10-CM

## 2015-03-13 DIAGNOSIS — I10 Essential (primary) hypertension: Secondary | ICD-10-CM

## 2015-03-13 DIAGNOSIS — F0390 Unspecified dementia without behavioral disturbance: Secondary | ICD-10-CM

## 2015-03-13 DIAGNOSIS — E785 Hyperlipidemia, unspecified: Secondary | ICD-10-CM

## 2015-03-13 LAB — LIPID PANEL
CHOL/HDL RATIO: 3.6 ratio
Cholesterol: 139 mg/dL (ref 0–200)
HDL: 39 mg/dL — AB (ref >40)
LDL CALC: 83 mg/dL (ref 0–99)
Triglycerides: 86 mg/dL (ref <150)
VLDL: 17 mg/dL (ref 0–40)

## 2015-03-13 LAB — VITAMIN B12: VITAMIN B 12: 91 pg/mL — AB (ref 180–914)

## 2015-03-13 LAB — CBC
HEMATOCRIT: 37.2 % (ref 36.0–46.0)
Hemoglobin: 12.1 g/dL (ref 12.0–15.0)
MCH: 30.4 pg (ref 26.0–34.0)
MCHC: 32.5 g/dL (ref 30.0–36.0)
MCV: 93.5 fL (ref 78.0–100.0)
PLATELETS: 123 10*3/uL — AB (ref 150–400)
RBC: 3.98 MIL/uL (ref 3.87–5.11)
RDW: 14.7 % (ref 11.5–15.5)
WBC: 10 10*3/uL (ref 4.0–10.5)

## 2015-03-13 LAB — BASIC METABOLIC PANEL
ANION GAP: 12 (ref 5–15)
BUN: 13 mg/dL (ref 6–20)
CO2: 25 mmol/L (ref 22–32)
Calcium: 7.8 mg/dL — ABNORMAL LOW (ref 8.9–10.3)
Chloride: 104 mmol/L (ref 101–111)
Creatinine, Ser: 0.86 mg/dL (ref 0.44–1.00)
GFR calc Af Amer: >60 mL/min (ref >60)
GLUCOSE: 104 mg/dL — AB (ref 65–99)
POTASSIUM: 3.1 mmol/L — AB (ref 3.5–5.1)
Sodium: 141 mmol/L (ref 135–145)

## 2015-03-13 LAB — TSH: TSH: 4.031 u[IU]/mL (ref 0.350–4.500)

## 2015-03-13 LAB — MRSA PCR SCREENING: MRSA by PCR: NEGATIVE

## 2015-03-13 MED ORDER — FAMOTIDINE IN NACL 20-0.9 MG/50ML-% IV SOLN
20.0000 mg | Freq: Two times a day (BID) | INTRAVENOUS | Status: DC
  Administered 2015-03-13 (×2): 20 mg via INTRAVENOUS
  Filled 2015-03-13 (×2): qty 50

## 2015-03-13 MED ORDER — GADOBENATE DIMEGLUMINE 529 MG/ML IV SOLN
10.0000 mL | Freq: Once | INTRAVENOUS | Status: AC | PRN
  Administered 2015-03-13: 10 mL via INTRAVENOUS

## 2015-03-13 MED ORDER — POTASSIUM CHLORIDE 10 MEQ/100ML IV SOLN
10.0000 meq | INTRAVENOUS | Status: AC
  Administered 2015-03-13 (×4): 10 meq via INTRAVENOUS
  Filled 2015-03-13 (×4): qty 100

## 2015-03-13 MED ORDER — POTASSIUM CHLORIDE 20 MEQ/15ML (10%) PO SOLN: 40.0000 meq | Freq: Two times a day (BID) | ORAL | Status: DC

## 2015-03-13 NOTE — Procedures (Signed)
Extubation Procedure Note  Patient Details:   Name: Shawna FlockBarbara C Banks DOB: [birth year] MRN: [id number]   Pt extubated to 4L [place] per MD order. Pt able to vocalize, no stridor noted. Pt tolerating well at this time, VS WNL. RT will continue to monitor.     Evaluation  O2 sats: stable throughout Complications: No apparent complications Patient did tolerate procedure well. Bilateral Breath Sounds: Clear, Diminished Suctioning: Airway Yes  Harley HallmarkKeen, Jaquelyn Sakamoto Lyman 03/13/2015, 1:59 PM

## 2015-03-13 NOTE — Progress Notes (Signed)
STROKE TEAM PROGRESS NOTE   HISTORY BRUCE MAYERS is an 69 y.o. female patient who presented to an outside ER with symptoms of speech problems, headache earlier this evening. CT of the brain done at outside ER showed a left parietal intracerebral hemorrhage. I spoke to the ER physician on phone. She appeared to have partial aphasia but had sustained antigravity strength in bilateral upper extremities, symmetric. After she arrived in our ER, she was noted to have worsening mental status. During the initial assessment in the triage, she is unable to cooperate with any neurological examination, unable to sustain antigravity strength.  She was admitted for further evaluation and treatment.   SUBJECTIVE (INTERVAL HISTORY) Her family is at the bedside. They became overwhelmed during the MD exam, and left the room. Patient is able to follow some commands and greeting to provider. She is trying to take ET tube off, on mittens. MRI pending.   OBJECTIVE Temp:  [97.5 F (36.4 C)-100.2 F (37.9 C)] 100 F (37.8 C) (11/21 0800) Pulse Rate:  [58-104] 62 (11/21 0900) Cardiac Rhythm:  [-]  Resp:  [13-33] 14 (11/21 0900) BP: (71-214)/(46-113) 89/53 mmHg (11/21 0900) SpO2:  [91 %-100 %] 100 % (11/21 0900) FiO2 (%):  [40 %-100 %] 40 % (11/21 0809) Weight:  [56.7 kg (125 lb)-58.2 kg (128 lb 4.9 oz)] 58.2 kg (128 lb 4.9 oz) (11/20 2104)  CBC:  Recent Labs Lab 03/12/15 1655 03/12/15 1659 03/13/15 0806  WBC 6.7  --  10.0  NEUTROABS 5.3  --   --   HGB 13.7 14.3 12.1  HCT 41.0 42.0 37.2  MCV 92.8  --  93.5  PLT 153  --  123*    Basic Metabolic Panel:  Recent Labs Lab 03/12/15 1655 03/12/15 1659 03/13/15 0806  NA 141 144 141  K 2.7* 2.8* 3.1*  CL 106 102 104  CO2 28  --  25  GLUCOSE 129* 125* 104*  BUN CREATININE 0.80 0.80 0.86  CALCIUM 8.5*  --  7.8*    Lipid Panel:    Component Value Date/Time   CHOL 139 03/13/2015 0806   TRIG 86 03/13/2015 0806   HDL 39* 03/13/2015 0806    CHOLHDL 3.6 03/13/2015 0806   VLDL 17 03/13/2015 0806   LDLCALC 83 03/13/2015 0806   HgbA1c: No results found for: HGBA1C Urine Drug Screen:    Component Value Date/Time   LABOPIA NONE DETECTED 03/12/2015 1946   COCAINSCRNUR NONE DETECTED 03/12/2015 1946   LABBENZ NONE DETECTED 03/12/2015 1946   AMPHETMU NONE DETECTED 03/12/2015 1946   THCU NONE DETECTED 03/12/2015 1946   LABBARB NONE DETECTED 03/12/2015 1946      IMAGING I have personally reviewed the radiological images below and agree with the radiology interpretations.  Ct Head Wo Contrast 03/12/2015  Stable intraparenchymal hemorrhage involving the LEFT temporal lobe with no significant increase in size from earlier CT. 2.4 x 3.5 x 2.5 cm dimensions correspond to an approximate volume of 10.5 mL.  03/12/2015   Acute intraparenchymal hemorrhage within the left temporoparietal lobe measuring up to 3.6 cm. There is mild mass effect on the adjacent left lateral ventricle. This is an atypical location and may be secondary to underlying vasculitis, hemorrhagic venous infarct or hemorrhagic metastasis. This needs dedicated evaluation with MRI.   Dg Chest Portable 1 View 03/12/2015  Endotracheal tube tip is in the lower trachea, 2.4 cm above the carina. No acute cardiopulmonary process suspected.   MRI with and  without contrast - pending  MRA - pending  TTE - pending  EEG - Impression: This sedated EEG is abnormal due to the presence of: 1. Occasional focal slowing over the left temporal region 2. Rare left temporal epileptiform discharges 3. Except for excess amount of diffuse low voltage beta activity. Clinical Correlation of the above findings indicates focal cerebral dysfunction over the left temporal region suggestive of underlying structural or physiologic abnormality with a possible tendency for seizures to arise from this region. Diffuse low voltage beta activity is commonly seen with sedating medications. There were no  electrographic seizures seen in this study.    PHYSICAL EXAM  Temp:  [97.5 F (36.4 C)-100.2 F (37.9 C)] 100 F (37.8 C) (11/21 0800) Pulse Rate:  [58-104] 95 (11/21 1251) Resp:  [13-33] 16 (11/21 1251) BP: (71-214)/(46-113) 116/102 mmHg (11/21 1251) SpO2:  [91 %-100 %] 100 % (11/21 1251) FiO2 (%):  [40 %-100 %] 40 % (11/21 1251) Weight:  [125 lb (56.7 kg)-128 lb 4.9 oz (58.2 kg)] 128 lb 4.9 oz (58.2 kg) (11/20 2104)  General - Well nourished, well developed, intubated on low dose propofol.  Ophthalmologic - Fundi not visualized due to noncooperation.  Cardiovascular - Regular rate and rhythm.  Neuro- intubated on vent and sedation, but able to open eyes on voice and greeting to provider. Able to follow central and peripheral simple commands. PERRL, EOMI, attending to both sides. Inconsistent on reaction to visual threat bilaterally. Facial seems symmetrical although not able to evaluate fully due to ET tube. Tongue in middle. Moving all extremities spontaneously and symmetrically. DTR 1+ and no babinski.   ASSESSMENT/PLAN Ms. Dustin FlockBarbara C Chalmers is a 69 y.o. female with history of hypertension, hyperlipidemia, early stage of dementia on aricept presenting with speech difficulties and HA. CT show a L temporal lobe hemorrhage.  Stroke:  left tempoparietal lobe hemorrhage (surface hemorrhage with 2 separate times of bleeding) secondary to hypertensive urgency vs Cerebral amyloid angiopathy  CT head L temporal lobe hemorrhage  Repeat CT with stable hemorrhage  MRI w/ & w/o  pending   MRA  pending   2D Echo  pending   LDL 83  HgbA1c pending  SCDs for VTE prophylaxis  Diet NPO time specified  No antithrombotic prior to admission  Therapy recommendations:  pending   Disposition:  pending  (lived alone PTA, cooked, cleaned, paid bills, Independent with ADLs)  Acute respiratory failure  Intubated in ED  CCM managing  Ok to extubate from neuro standpoint after  MRI  Possible Seizure  In ED, unresponsive to voice or noxious stimuli with right gaze deviation, no tonic activity  most likely secondary to hemorrhage  Treated with ativan, received fosphenytoin  Now on Keppra 1000mg  bid  EEG left temporal focal slowing with rare epileptiform discharges.   Hypertensive Emergency / Hypotension  BP 180/97, 214/98 on arrival. As high as 193/113, as low as 71/58  Remains low this am 89/53, CCM feels due to propofol  Homed meds:  vasotec  BP goal < 140 first day and then < 180 after.  Hyperlipidemia  Home meds:  zocor 40, resumed in hospital  LDL 83, goal < 70  Resume statin prior to discharge  Other Stroke Risk Factors  Advanced age  Baseline dementia  On aricept  Has been functioning independently in her own envirmonment  Other Active Problems  Hypokalemia 2.7->3.1. Will replace per OG and recheck in am.  Thrombocytopenia PLT 153->123  Hospital day # 1  This  patient is critically ill due to left tempoparietal ICH and seizure and at significant risk of neurological worsening, death form hematoma enlargement, cerebral edema, brain herniation, and status epilepticus. This patient's care requires constant monitoring of vital signs, hemodynamics, respiratory and cardiac monitoring, review of multiple databases, neurological assessment, discussion with family, other specialists and medical decision making of high complexity. I spent 40 minutes of neurocritical care time in the care of this patient.  Marvel Plan, MD PhD Stroke Neurology 03/13/2015 1:07 PM    To contact Stroke Continuity provider, please refer to [email or web address]. After hours, contact General Neurology

## 2015-03-13 NOTE — Progress Notes (Signed)
EEG completed, results pending. 

## 2015-03-13 NOTE — Care Management Note (Signed)
Case Management Note  Patient Details  Name: Shawna FlockBarbara C Banks MRN: [id number] Date of Birth: October 30, 1945  Subjective/Objective:    Pt admitted on 03/12/15 with ICH.  PTA, pt independent of ADLS; lives alone.               Action/Plan: Will follow for discharge planning as pt progresses.    Expected Discharge Date:                  Expected Discharge Plan:  IP Rehab Facility  In-House Referral:     Discharge planning Services  CM Consult  Post Acute Care Choice:    Choice offered to:     DME Arranged:    DME Agency:     HH Arranged:    HH Agency:     Status of Service:  In process, will continue to follow  Medicare Important Message Given:    Date Medicare IM Given:    Medicare IM give by:    Date Additional Medicare IM Given:    Additional Medicare Important Message give by:     If discussed at Long Length of Stay Meetings, dates discussed:    Additional Comments:  Quintella BatonJulie W. Todrick Siedschlag, RN, BSN  Trauma/Neuro ICU Case Manager [phone]

## 2015-03-13 NOTE — Progress Notes (Signed)
SLP Cancellation Note  Patient Details Name: Dustin FlockBarbara C Casselman MRN: [id number] DOB: 12/15/1945   Cancelled treatment:       Reason Eval/Treat Not Completed: Medical issues which prohibited therapy  Ferdinand LangoLeah Marylee Belzer MA, CCC-SLP [phone]  Ferdinand LangoMcCoy Torian Quintero Meryl 03/13/2015, 8:44 AM

## 2015-03-13 NOTE — Progress Notes (Signed)
  Echocardiogram 2D Echocardiogram has been performed.  Shawna SavoyCasey N Tifini Banks 03/13/2015, 1:55 PM

## 2015-03-13 NOTE — Consult Note (Signed)
PULMONARY / CRITICAL CARE MEDICINE   Name: Shawna Banks MRN: [id number] DOB: [birth year]    ADMISSION DATE:  03/12/2015 CONSULTATION DATE:  03/12/2015  REFERRING MD :  Dr. Hosie Poisson  CHIEF COMPLAINT:  Hemorrhagic ICH, intubated  Brief patient description:   69 y/o female with a history of dementia presented on 11/20 with confusion from an acute spontaneous intraparenchymal hemorrhage.  Intubated due to encephalopathy and possibly seizure activity.   SUBJECTIVE:  Following commands this morning, EEG performed, results pending  VITAL SIGNS: Temp:  [97.5 F (36.4 C)-100.2 F (37.9 C)] 100 F (37.8 C) (11/21 0800) Pulse Rate:  [58-104] 62 (11/21 0900) Resp:  [13-33] 14 (11/21 0900) BP: (71-214)/(46-113) 89/53 mmHg (11/21 0900) SpO2:  [91 %-100 %] 100 % (11/21 0900) FiO2 (%):  [40 %-100 %] 40 % (11/21 0809) Weight:  [56.7 kg (125 lb)-58.2 kg (128 lb 4.9 oz)] 58.2 kg (128 lb 4.9 oz) (11/20 2104) HEMODYNAMICS:   VENTILATOR SETTINGS: Vent Mode:  [-] PRVC FiO2 (%):  [40 %-100 %] 40 % Set Rate:  [14 bmp] 14 bmp Vt Set:  [500 mL] 500 mL PEEP:  [5 cmH20] 5 cmH20 Plateau Pressure:  [14 cmH20-15 cmH20] 15 cmH20 INTAKE / OUTPUT:  Intake/Output Summary (Last 24 hours) at 03/13/15 1044 Last data filed at 03/13/15 0800  Gross per 24 hour  Intake  64.33 ml  Output   2650 ml  Net -2585.67 ml    PHYSICAL EXAMINATION:  Gen: sedated on vent HENT: NCAT, ETT in place PULM: CTA B, vent supported breaths CV: RRR, no mgr GI: BS+, soft, nontender MSK: no bony deformities Neuro: sedated on my exam, but was noted to follow commands prior to sedation  LABS:  CBC  Recent Labs Lab 03/12/15 1655 03/12/15 1659 03/13/15 0806  WBC 6.7  --  10.0  HGB 13.7 14.3 12.1  HCT 41.0 42.0 37.2  PLT 153  --  123*   Coag's  Recent Labs Lab 03/12/15 1655  APTT 27  INR 1.14   BMET  Recent Labs Lab 03/12/15 1655 03/12/15 1659 03/13/15 0806  NA 141 144 141  K 2.7* 2.8* 3.1*  CL 106  102 104  CO2 28  --  25  BUN CREATININE 0.80 0.80 0.86  GLUCOSE 129* 125* 104*   Electrolytes  Recent Labs Lab 03/12/15 1655 03/13/15 0806  CALCIUM 8.5* 7.8*   Sepsis Markers No results for input(s): LATICACIDVEN, PROCALCITON, O2SATVEN in the last 168 hours. ABG  Recent Labs Lab 03/12/15 2110  PHART 7.399  PCO2ART 38.3  PO2ART 420*   Liver Enzymes  Recent Labs Lab 03/12/15 1655  AST 19  ALT 11*  ALKPHOS 76  BILITOT 0.2*  ALBUMIN 4.2   Cardiac Enzymes No results for input(s): TROPONINI, PROBNP in the last 168 hours. Glucose No results for input(s): GLUCAP in the last 168 hours.  Imaging Ct Head Wo Contrast  03/12/2015  CLINICAL DATA:  Continued surveillance intraparenchymal hemorrhage. Alteration of mental status, with apparent decline in neuro exam. EXAM: CT HEAD WITHOUT CONTRAST TECHNIQUE: Contiguous axial images were obtained from the base of the skull through the vertex without intravenous contrast. COMPARISON:  Multiple priors, most recent earlier today at [facility] at 4:30 p.m. FINDINGS: Stable intraparenchymal hemorrhage involving the LEFT temporal lobe with no significant increase in size compared with the earlier scan. Cross-sectional measurements are 2.4 x 3.5 cm, extending over a 2.5 cm craniocaudal extent, corresponding to an approximate volume of 10.5 mL.  Mild surrounding edema without significant midline shift or uncal herniation. No significant subarachnoid, subdural, or intraventricular extension. Mild cerebral and cerebellar atrophy. Moderate hypoattenuation of white matter suggesting chronic microvascular ischemic change. Calvarium is intact. Minor fluid fills the LEFT ethmoid air cell. No mastoid or middle ear fluid. Proximal vascular calcification. IMPRESSION: Stable intraparenchymal hemorrhage involving the LEFT temporal lobe with no significant increase in size from earlier CT. 2.4 x 3.5 x 2.5 cm dimensions correspond to an  approximate volume of 10.5 mL. Electronically Signed   By: Elsie Stain M.D.   On: 03/12/2015 19:20   Ct Head Wo Contrast  03/12/2015  CLINICAL DATA:  Evaluate for intracranial hemorrhage. Sudden onset of confusion. EXAM: CT HEAD WITHOUT CONTRAST TECHNIQUE: Contiguous axial images were obtained from the base of the skull through the vertex without intravenous contrast. COMPARISON:  MR brain 04/08/2013 FINDINGS: There is a 3.6 x 2.5 cm acute intraparenchymal hemorrhage within the left temporoparietal lobe (image 13; series 2). There is mild mass effect on the adjacent left lateral ventricle. Ventricles and sulci are appropriate for patient's age. Periventricular and subcortical white matter hypodensity, compatible with chronic small vessel ischemic change. Orbits are unremarkable. Mucosal thickening within the left maxillary sinus and ethmoid air cells. Mastoid air cells are unremarkable. Calvarium is intact. IMPRESSION: Acute intraparenchymal hemorrhage within the left temporoparietal lobe measuring up to 3.6 cm. There is mild mass effect on the adjacent left lateral ventricle. This is an atypical location and may be secondary to underlying vasculitis, hemorrhagic venous infarct or hemorrhagic metastasis. This needs dedicated evaluation with MRI. Critical Value/emergent results were called by telephone at the time of interpretation on 03/12/2015 at 4:49 pm to Dr. Marily Memos , who verbally acknowledged these results. Electronically Signed   By: Annia Belt M.D.   On: 03/12/2015 16:57   Dg Chest Portable 1 View  03/12/2015  CLINICAL DATA:  Intracranial hemorrhage. Intubation. Initial encounter. EXAM: PORTABLE CHEST 1 VIEW COMPARISON:  Limited correlation made with left shoulder radiographs 03/22/2014. No prior chest radiographs. FINDINGS: 2018 hours. Tip of the endotracheal tube is 2.4 cm above the carina. The heart size and mediastinal contours are normal aside from mild aortic atherosclerosis. There is  mild interstitial prominence of the lungs, but no edema, confluent airspace opacity or pleural effusion. There is no pneumothorax. Posttraumatic deformity of the proximal left humerus noted. IMPRESSION: Endotracheal tube tip is in the lower trachea, 2.4 cm above the carina. No acute cardiopulmonary process suspected. Electronically Signed   By: Carey Bullocks M.D.   On: 03/12/2015 20:45     ASSESSMENT / PLAN: 86F with left parietal lobe IPH concern for HTN CVA vs Cerebral amyloid angiopathy, vs other and resultant partial complex seizures and non convulsive status epilepticus.  Currently intubated for airway protection but mental status is improving so likely can be extubated today  PULMONARY -intubated for airway protection, could be extubated today as oxygenation and vent mechanics are OK  P:   - Decrease PEEP/FiO2 to maintain pulse oximetry >88% - SBT after MRI brain today - PPI for GI PPx > change to pepcid   CARDIOVASCULAR - HTN, now mildly low A: Slightly hypotensive 11/21, likely propofol related P:  - goal SBP per neurology 120-140 - will stop propofol with extubation today   NEUROLOGIC A:   - Acute spontaneous left parietal intraparenchymal hemorrhage - Partial complex seizures > appeared to have resolved - Non convulsive status epilepticus  P:   RASS goal: 0 - anti-epileptics per  neurology - Keppra 1G BID per neurology - MRI brain today - SBP 120-140 - further management per neurology team  Feeding: advance diet if extubated, post SLP eval Analgesia: prn fentanyl Sedation: propofol Thromboprophylaxis: SCD HOB >30 degrees Ulcer prophylaxis: pepcid Glucose control: adequate  Daughters updated bedside by me  Total critical care time: 35 min  Critical care time was exclusive of separately billable procedures and treating other patients.  Critical care was necessary to treat or prevent imminent or life-threatening deterioration.  Heber CarolinaBrent Elijio Staples, MD [place]  PCCM Pager: [phone] Cell: [phone] After 3pm or if no response, call [phone]

## 2015-03-13 NOTE — Procedures (Signed)
ELECTROENCEPHALOGRAM REPORT  Date of Study: 03/13/2015  Patient's Name: Shawna FlockBarbara C Banks MRN: [id number] Date of Birth: [birth year]  Referring Provider: Dr. Elspeth ChoPeter Sumner  Clinical History: This is a 69 year old woman with a left parietal intracerebral hemorrhage with worsening altered mental status with fixed right gaze deviation.  Medications: propofol (DIPRIVAN) 1000 MG/100ML infusion levETIRAcetam (KEPPRA) 1,000 mg in sodium chloride 0.9 % 100 mL IVPB acetaminophen (TYLENOL) tablet 650 mg labetalol (NORMODYNE,TRANDATE) 500 mg in dextrose 5 % 125 mL (4 mg/mL) infusion nicardipine (CARDENE) 20mg  in 0.86% saline 200ml IV infusion (0.1 mg/ml) pantoprazole (PROTONIX) injection 40 mg  Technical Summary: A multichannel digital EEG recording measured by the international 10-20 system with electrodes applied with paste and impedances below 5000 ohms performed in our laboratory with EKG monitoring in an intubated and sedated patient.  Hyperventilation and photic stimulation were not performed.  The digital EEG was referentially recorded, reformatted, and digitally filtered in a variety of bipolar and referential montages for optimal display.    Description: The patient is intubated and sedated on Propofol during the recording. There is no clear posterior dominant rhythm. The background consists of sleep architecture with vertex waves and sleep spindles seen. There is an excess amount of diffuse low voltage beta activity seen throughout the recording. There is occasional focal 3-4 Hz slowing seen over the left temporal region. There are rare sharp waves over the left mid-temporal region. There were no electrographic seizures seen.    EKG lead was unremarkable.  Impression: This sedated EEG is abnormal due to the presence of: 1. Occasional focal slowing over the left temporal region 2. Rare left temporal epileptiform discharges 3. Except for excess amount of diffuse low voltage beta  activity.  Clinical Correlation of the above findings indicates focal cerebral dysfunction over the left temporal region suggestive of underlying structural or physiologic abnormality with a possible tendency for seizures to arise from this region. Diffuse low voltage beta activity is commonly seen with sedating medications. There were no electrographic seizures seen in this study.    Patrcia DollyKaren Maddox Hlavaty, M.D.

## 2015-03-13 NOTE — Progress Notes (Signed)
RT Note: Pt transported to MRI & back to 20M with no complications. RT will continue to monitor.

## 2015-03-13 NOTE — Progress Notes (Addendum)
PT Cancellation Note  Patient Details Name: Shawna FlockBarbara C Turay MRN: [id number] DOB: Dec 03, 1945   Cancelled Treatment:    Reason Eval/Treat Not Completed: Patient not medically ready. Pt remains intubated. Pt currently at MRI. PT to return when appropriate.   Marcene BrawnChadwell, Susi Goslin Marie 03/13/2015, 11:26 AM  Lewis ShockAshly Joyanne Eddinger, PT, DPT Pager #: [phone] Office #: [phone]

## 2015-03-14 DIAGNOSIS — E854 Organ-limited amyloidosis: Secondary | ICD-10-CM | POA: Diagnosis present

## 2015-03-14 DIAGNOSIS — I680 Cerebral amyloid angiopathy: Secondary | ICD-10-CM

## 2015-03-14 DIAGNOSIS — E876 Hypokalemia: Secondary | ICD-10-CM

## 2015-03-14 DIAGNOSIS — I10 Essential (primary) hypertension: Secondary | ICD-10-CM | POA: Diagnosis present

## 2015-03-14 DIAGNOSIS — R569 Unspecified convulsions: Secondary | ICD-10-CM | POA: Diagnosis present

## 2015-03-14 DIAGNOSIS — E785 Hyperlipidemia, unspecified: Secondary | ICD-10-CM | POA: Diagnosis present

## 2015-03-14 LAB — CBC
HCT: 39.1 % (ref 36.0–46.0)
Hemoglobin: 12.4 g/dL (ref 12.0–15.0)
MCH: 29.9 pg (ref 26.0–34.0)
MCHC: 31.7 g/dL (ref 30.0–36.0)
MCV: 94.2 fL (ref 78.0–100.0)
PLATELETS: 106 10*3/uL — AB (ref 150–400)
RBC: 4.15 MIL/uL (ref 3.87–5.11)
RDW: 14.8 % (ref 11.5–15.5)
WBC: 6.4 10*3/uL (ref 4.0–10.5)

## 2015-03-14 LAB — BASIC METABOLIC PANEL
Anion gap: 6 (ref 5–15)
BUN: 6 mg/dL (ref 6–20)
CALCIUM: 8.2 mg/dL — AB (ref 8.9–10.3)
CO2: 29 mmol/L (ref 22–32)
CREATININE: 0.54 mg/dL (ref 0.44–1.00)
Chloride: 107 mmol/L (ref 101–111)
GFR calc Af Amer: >60 mL/min (ref >60)
GLUCOSE: 98 mg/dL (ref 65–99)
Potassium: 3.1 mmol/L — ABNORMAL LOW (ref 3.5–5.1)
Sodium: 142 mmol/L (ref 135–145)

## 2015-03-14 LAB — HEMOGLOBIN A1C
Hgb A1c MFr Bld: 5.8 % — ABNORMAL HIGH (ref 4.8–5.6)
Mean Plasma Glucose: 120 mg/dL

## 2015-03-14 LAB — URINE CULTURE: CULTURE: NO GROWTH

## 2015-03-14 MED ORDER — CHLORHEXIDINE GLUCONATE 0.12 % MT SOLN
OROMUCOSAL | Status: AC
Start: 2015-03-14 — End: 2015-03-14
  Filled 2015-03-14: qty 15

## 2015-03-14 MED ORDER — SIMVASTATIN 40 MG PO TABS
40.0000 mg | ORAL_TABLET | Freq: Every day | ORAL | Status: DC
  Administered 2015-03-14: 40 mg via ORAL
  Filled 2015-03-14: qty 1

## 2015-03-14 MED ORDER — DONEPEZIL HCL 10 MG PO TABS
10.0000 mg | ORAL_TABLET | Freq: Every day | ORAL | Status: DC
  Administered 2015-03-14: 10 mg via ORAL
  Filled 2015-03-14: qty 1

## 2015-03-14 MED ORDER — LEVETIRACETAM 500 MG PO TABS
1000.0000 mg | ORAL_TABLET | Freq: Two times a day (BID) | ORAL | Status: DC
  Administered 2015-03-14 – 2015-03-15 (×2): 1000 mg via ORAL
  Filled 2015-03-14 (×2): qty 2

## 2015-03-14 MED ORDER — ENALAPRIL MALEATE 20 MG PO TABS
20.0000 mg | ORAL_TABLET | Freq: Every day | ORAL | Status: DC
  Administered 2015-03-14 – 2015-03-15 (×2): 20 mg via ORAL
  Filled 2015-03-14 (×2): qty 1

## 2015-03-14 MED ORDER — POTASSIUM CHLORIDE 10 MEQ/100ML IV SOLN
10.0000 meq | INTRAVENOUS | Status: AC
  Administered 2015-03-14 (×4): 10 meq via INTRAVENOUS
  Filled 2015-03-14 (×4): qty 100

## 2015-03-14 MED ORDER — PANTOPRAZOLE SODIUM 40 MG PO TBEC
40.0000 mg | DELAYED_RELEASE_TABLET | Freq: Every day | ORAL | Status: DC
  Administered 2015-03-14 – 2015-03-15 (×2): 40 mg via ORAL
  Filled 2015-03-14 (×2): qty 1

## 2015-03-14 NOTE — Progress Notes (Signed)
[facility] LAB RE[facility]ACEMENT ONLY  The patient does [facility] based on the criteria listed below:   1. Is GFR >/= 40 ml/min? Yes.    Patient's GFR today is >60 2. Is urine output >/= 0.5 ml/kg/hr for the last 6 hours? Yes.   Patient's UOP is 0.7 ml/kg/hr 3. Is BUN < 60 mg/dL? Yes.    Patient's BUN today is 6 4. Abnormal electrolyte(s): K 3.1 5. Ordered repletion with: per protocol 6. If a panic level lab has been reported, has the CCM MD in charge been notified? No..   Physician:    Markus DaftWHELAN, Rasheka Denard A 03/14/2015 6:29 AM

## 2015-03-14 NOTE — Evaluation (Signed)
Clinical/Bedside Swallow Evaluation Patient Details  Name: Shawna Banks MRN: [id number] Date of Birth: 1946/02/26  Today's Date: 03/14/2015 Time: SLP Start Time (ACUTE ONLY): 1005 SLP Stop Time (ACUTE ONLY): 1026 SLP Time Calculation (min) (ACUTE ONLY): 21 min  Past Medical History:  Past Medical History  Diagnosis Date  . Hypertension   . Hypercholesteremia   . Dementia    Past Surgical History: History reviewed. No pertinent past surgical history. HPI:  pt presents with L Temporoparietal ICH, which Neurology notes as two seperate times of bleeding. pt intubated 11/20-11/21 and abnormal EEG results. pt with hx of HTN and Dementia.    Assessment / Plan / Recommendation Clinical Impression  Pt's oropharyngeal swallow appears to be within gross functional limits without overt signs concerning for aspiration. Risk factors for dysphagia do include ICH and recent intubation, therefore will f/u briefly to assess for tolerance. Will initiate regular textures and thin liquids.    Aspiration Risk  Mild aspiration risk    Diet Recommendation  Regular diet, thin liquids   Medication Administration: Whole meds with liquid    Other  Recommendations Oral Care Recommendations: Oral care BID   Follow up Recommendations  Outpatient SLP    Frequency and Duration min 1 x/week  1 week       Swallow Study   General HPI: pt presents with L Temporoparietal ICH, which Neurology notes as two seperate times of bleeding. pt intubated 11/20-11/21 and abnormal EEG results. pt with hx of HTN and Dementia.  Type of Study: Bedside Swallow Evaluation Previous Swallow Assessment: none in chart Diet Prior to this Study: NPO Temperature Spikes Noted: Yes (100.2) Respiratory Status: Room air History of Recent Intubation: Yes Length of Intubations (days): 2 days Date extubated: 03/13/15 Behavior/Cognition: Alert;Cooperative;Pleasant mood;Other (Comment);Requires cueing (aphasia) Oral Cavity  Assessment: Within Functional Limits Oral Care Completed by SLP: No Oral Cavity - Dentition: Adequate natural dentition Vision: Functional for self-feeding Self-Feeding Abilities: Able to feed self Patient Positioning: Upright in bed Baseline Vocal Quality: Normal Volitional Cough: Weak (suspect impacted by decreased ability to follow commands) Volitional Swallow: Unable to elicit    Oral/Motor/Sensory Function Overall Oral Motor/Sensory Function: Within functional limits (as observed - some difficulty following commands)   Ice Chips Ice chips: Not tested   Thin Liquid Thin Liquid: Within functional limits Presentation: Cup;Self Fed;Straw    Nectar Thick Nectar Thick Liquid: Not tested   Honey Thick Honey Thick Liquid: Not tested   Puree Puree: Within functional limits Presentation: Self Fed;Spoon   Solid Solid: Within functional limits Presentation: Self Fed      Shawna Banks, M.A. CCC-SLP [phone]  Shawna Banks, Shawna Banks 03/14/2015,12:01 PM

## 2015-03-14 NOTE — Progress Notes (Signed)
Pt arrived to unit alert, verbal with no noted distress. Pt with confusion, able to follow simple commands. Pt oriented to room. Safety measures in place. Family at bedside. Call bell within reach.  Will continue to monitor.

## 2015-03-14 NOTE — Progress Notes (Signed)
[facility] PCCM  S: extubated yesterday, walking around unit today, passed swallow eval, no hoarseness today  O: Filed Vitals:   03/14/15 0700 03/14/15 0810 03/14/15 0900 03/14/15 1000  BP: 153/84 153/80 145/89 167/85  Pulse: 77 76 84 86  Temp:  98.2 F (36.8 C)    TempSrc:  Oral    Resp: 17 17 16 19   Height:      Weight:      SpO2: 97% 97% 95% 95%   RA  Gen: well appearing HENT: OP clear,  neck supple PULM: CTA B, normal effort CV: RRR, no mgr, trace edema GI: BS+, soft, nontender Derm: no cyanosis or rash Psyche: normal mood and affect  CBC    Component Value Date/Time   WBC 6.4 03/14/2015 0310   RBC 4.15 03/14/2015 0310   HGB 12.4 03/14/2015 0310   HCT 39.1 03/14/2015 0310   PLT 106* 03/14/2015 0310   MCV 94.2 03/14/2015 0310   MCH 29.9 03/14/2015 0310   MCHC 31.7 03/14/2015 0310   RDW 14.8 03/14/2015 0310   LYMPHSABS 1.0 03/12/2015 1655   MONOABS 0.4 03/12/2015 1655   EOSABS 0.1 03/12/2015 1655   BASOSABS 0.0 03/12/2015 1655    BMET    Component Value Date/Time   NA 142 03/14/2015 0310   K 3.1* 03/14/2015 0310   CL 107 03/14/2015 0310   CO2 29 03/14/2015 0310   GLUCOSE 98 03/14/2015 0310   BUN 6 03/14/2015 0310   CREATININE 0.54 03/14/2015 0310   CALCIUM 8.2* 03/14/2015 0310   GFRNONAA >60 03/14/2015 0310   GFRAA >60 03/14/2015 0310   Impression: Active Problems:   ICH (intracerebral hemorrhage) (HCC)   Status epilepticus (HCC)   Hypokalemia Respiratory failure resolved> was due to encephalopathy from seizure, no lingering pulmonary issues  Plan Replete potassium Continue secondary stroke prevention and anti-epileptic meds per neurology PT/OT Advance diet  PCCM to sign off  Heber CarolinaBrent McQuaid, MD [facility] PCCM Pager: [phone] Cell: [phone] After 3pm or if no response, call [phone]

## 2015-03-14 NOTE — Evaluation (Signed)
Speech Language Pathology Evaluation Patient Details Name: Shawna FlockBarbara C Banks MRN: [id number] DOB: [birth year] Today's Date: 03/14/2015 Time: [id number] SLP Time Calculation (min) (ACUTE ONLY): 21 min  Problem List:  Patient Active Problem List   Diagnosis Date Noted  . Status epilepticus (HCC) 03/14/2015  . Hypokalemia 03/14/2015  . Seizures (HCC)   . Cerebral amyloid angiopathy   . HLD (hyperlipidemia)   . Essential hypertension   . ICH (intracerebral hemorrhage) (HCC) 03/12/2015  . Proximal humerus fracture 02/28/2014   Past Medical History:  Past Medical History  Diagnosis Date  . Hypertension   . Hypercholesteremia   . Dementia    Past Surgical History: History reviewed. No pertinent past surgical history. HPI:  pt presents with L Temporoparietal ICH, which Neurology notes as two seperate times of bleeding. pt intubated 11/20-11/21 and abnormal EEG results. pt with hx of HTN and Dementia.    Assessment / Plan / Recommendation Clinical Impression  Pt has a receptive and expressive aphasia with moderate-severe impairments. She does not reliably answer basic biographical or environmental yes/no questions, although she does follow simple one-step commands with Mod gestural cues. Max cues were provided for receptive identification of common objects in a field of two and for repetition at the word level during confrontational naming trials. Pt will need acute SLP f/u during hospital stay, as well as 24/7 supervision and OP SLP upon discharge.     SLP Assessment  Patient needs continued Speech Lanaguage Pathology Services    Follow Up Recommendations  Outpatient SLP;24 hour supervision/assistance    Frequency and Duration min 2x/week  2 weeks      SLP Evaluation Prior Functioning  Cognitive/Linguistic Baseline: Baseline deficits Baseline deficit details: h/o dementia but living alone with strong social/family supports, family helped with pill box but she managed her  finances still Type of Home: House  Lives With: Alone Available Help at Discharge: Family;Friend(s);Available 24 hours/day   Cognition  Overall Cognitive Status: Difficult to assess (aphasia) Orientation Level: Oriented to person;Disoriented to place;Disoriented to time;Disoriented to situation    Comprehension  Auditory Comprehension Overall Auditory Comprehension: Impaired Yes/No Questions: Impaired Basic Biographical Questions: 26-50% accurate Basic Immediate Environment Questions: 25-49% accurate Commands: Impaired One Step Basic Commands: 50-74% accurate EffectiveTechniques: Visual/Gestural cues    Expression Expression Primary Mode of Expression: Verbal Verbal Expression Overall Verbal Expression: Impaired Initiation: No impairment Automatic Speech:  (unable to state name) Level of Generative/Spontaneous Verbalization: Sentence Repetition: Impaired Level of Impairment: Word level Naming: Impairment Responsive: 0-25% accurate Confrontation: Impaired Verbal Errors: Neologisms;Not aware of errors Non-Verbal Means of Communication: Other (comment) (facial expressions) Written Expression Dominant Hand: Left   Oral / Motor Oral Motor/Sensory Function Overall Oral Motor/Sensory Function: Within functional limits Motor Speech Overall Motor Speech: Other (comment) (likely intact, but difficult to assess due to aphasia)    Maxcine HamLaura Paiewonsky, M.A. CCC-SLP [phone]  Maxcine Hamaiewonsky, Macel Yearsley 03/14/2015, 12:14 PM

## 2015-03-14 NOTE — Evaluation (Signed)
Occupational Therapy Evaluation Patient Details Name: Shawna Banks MRN: [id number] DOB: 07/02/1945 Today's Date: 03/14/2015    History of Present Illness pt presents with L Temporoparietal ICH, which Neurology notes as two seperate times of bleeding.  pt intubated 11/20-11/21 and abnormal EEG results.  pt with hx of HTN and Dementia.     Clinical Impression   Pt was living independently with intermittent assistance of family for IADL.  Pt requiring min guard assist without a device for mobility, but gait appears guarded.  Able to use ADL items upon presentation.  Direction following requires multimodal cues with pt demonstrating aphasia.  Will follow acutely.  Pt has supportive daughters for a home discharge and follow up for therapies.    Follow Up Recommendations  Outpatient OT;Supervision/Assistance - 24 hour    Equipment Recommendations  None recommended by OT    Recommendations for Other Services       Precautions / Restrictions Precautions Precautions: Fall Restrictions Weight Bearing Restrictions: No      Mobility Bed Mobility Overal bed mobility: Needs Assistance Bed Mobility: Supine to Sit;Sit to Supine     Supine to sit: Supervision Sit to supine: Supervision   General bed mobility comments: pt moves slowly and question if this is due to processing of direction instead of a mobility deficit.    Transfers Overall transfer level: Needs assistance Equipment used: None Transfers: Sit to/from Stand Sit to Stand: Min guard         General transfer comment: pt with definite use of UEs and again slow to complete task, but question if this is due to cognition/communication.      Balance Overall balance assessment: Needs assistance Sitting-balance support: No upper extremity supported;Feet supported Sitting balance-Leahy Scale: Good     Standing balance support: No upper extremity supported;During functional activity Standing balance-Leahy Scale: Fair                              ADL Overall ADL's : Needs assistance/impaired Eating/Feeding: NPO   Grooming: Wash/dry hands;Wash/dry face;Brushing hair;Sitting;Set up   Upper Body Bathing: Supervision/ safety;Set up;Sitting   Lower Body Bathing: Min guard;Sit to/from stand   Upper Body Dressing : Minimal assistance;Sitting   Lower Body Dressing: Min guard;Sit to/from stand   Toilet Transfer: Min guard;Ambulation           Functional mobility during ADLs: Min guard       Vision Vision Assessment?: No apparent visual deficits   Perception     Praxis      Pertinent Vitals/Pain Pain Assessment: No/denies pain Faces Pain Scale: No hurt     Hand Dominance Left   Extremity/Trunk Assessment Upper Extremity Assessment Upper Extremity Assessment: Overall WFL for tasks assessed   Lower Extremity Assessment Lower Extremity Assessment: RLE deficits/detail RLE Deficits / Details: Mild decreased strength grossly 4 - 4+/5.  Sensation intact.   RLE Coordination: decreased fine motor   Cervical / Trunk Assessment Cervical / Trunk Assessment: Normal   Communication Communication Communication: Expressive difficulties   Cognition Arousal/Alertness: Awake/alert Behavior During Therapy: WFL for tasks assessed/performed Overall Cognitive Status: Difficult to assess (aphasia) Area of Impairment: Following commands       Following Commands: Follows one step commands inconsistently (following commands require multimodal cues, in context)           General Comments       Exercises       Shoulder Instructions  Home Living Family/patient expects to be discharged to:: Private residence Living Arrangements: Alone Available Help at Discharge: Family;Friend(s);Available 24 hours/day Type of Home: House Home Access: Stairs to enter Enter[employer] CorporationEntrance Stairs-Number of Steps: couple Entrance Stairs-Rails: None Home Layout: One level     Bathroom Shower/Tub:  Tub/shower unit;Door   [employer]ied Waste IndustriesBathroom Toilet: Standard     Home Equipment: None      Lives With: Alone    Prior Functioning/Environment Level of Independence: Independent        Comments: Help with driving and daughter would place pills in pill box each week.      OT Diagnosis: Generalized weakness;Cognitive deficits   OT Problem List: Impaired balance (sitting and/or standing);Decreased cognition;Decreased safety awareness   OT Treatment/Interventions:      OT Goals(Current goals can be found in the care plan section) Acute Rehab OT Goals Patient Stated Goal: Per daughters to be as independent as possible OT Goal Formulation: With patient Time For Goal Achievement: 03/21/15 Potential to Achieve Goals: Good ADL Goals Pt Will Perform Grooming: with supervision;standing Pt Will Perform Upper Body Bathing: with supervision;sitting;standing Pt Will Perform Lower Body Bathing: with supervision;sit to/from stand Pt Will Perform Upper Body Dressing: with supervision;sitting Pt Will Perform Lower Body Dressing: with supervision;sit to/from stand Pt Will Transfer to Toilet: with supervision;ambulating Pt Will Perform Toileting - Clothing Manipulation and hygiene: with supervision;sit to/from stand Pt Will Perform Tub/Shower Transfer: Tub transfer;with supervision;ambulating Additional ADL Goal #1: Pt will use call button/TV remote independently.  OT Frequency:     Barriers to D/C:            Co-evaluation   Reason for Co-Treatment: Necessary to address cognition/behavior during functional activity PT goals addressed during session: Mobility/safety with mobility;Balance;Strengthening/ROM        End of Session Equipment Utilized During Treatment: Gait belt Nurse Communication: Mobility status  Activity Tolerance: Patient tolerated treatment well Patient left: in bed;with call bell/phone within reach;with bed alarm set;with family/visitor present   Time: [id number] OT Time  Calculation (min): 33 min Charges:  OT General Charges $OT Visit: 1 Procedure OT Evaluation $Initial OT Evaluation Tier I: 1 Procedure G-Codes:    Evern BioMayberry, Jiyah Torpey Lynn 03/14/2015, 12:15 PM  [phone]

## 2015-03-14 NOTE — Progress Notes (Signed)
STROKE TEAM PROGRESS NOTE   SUBJECTIVE (INTERVAL HISTORY) Her family is at the bedside. Extubated without difficulty yesterday afternoon. She is awake alert and pleasant but still has aphasia, moving all extremities.   OBJECTIVE Temp:  [98.2 F (36.8 C)-99.1 F (37.3 C)] 98.2 F (36.8 C) (11/22 0810) Pulse Rate:  [62-105] 84 (11/22 0900) Cardiac Rhythm:  [-]  Resp:  [14-26] 16 (11/22 0900) BP: (100-153)/(55-102) 145/89 mmHg (11/22 0900) SpO2:  [94 %-100 %] 95 % (11/22 0900) FiO2 (%):  [40 %] 40 % (11/21 1251)  CBC:  Recent Labs Lab 03/12/15 1655  03/13/15 0806 03/14/15 0310  WBC 6.7  --  10.0 6.4  NEUTROABS 5.3  --   --   --   HGB 13.7  < > 12.1 12.4  HCT 41.0  < > 37.2 39.1  MCV 92.8  --  93.5 94.2  PLT 153  --  123* 106*  < > = values in this interval not displayed.  Basic Metabolic Panel:   Recent Labs Lab 03/13/15 0806 03/14/15 0310  NA 141 142  K 3.1* 3.1*  CL 104 107  CO2 25 29  GLUCOSE 104* 98  BUN 13 6  CREATININE 0.86 0.54  CALCIUM 7.8* 8.2*    Lipid Panel:     Component Value Date/Time   CHOL 139 03/13/2015 0806   TRIG 86 03/13/2015 0806   HDL 39* 03/13/2015 0806   CHOLHDL 3.6 03/13/2015 0806   VLDL 17 03/13/2015 0806   LDLCALC 83 03/13/2015 0806   HgbA1c:  Lab Results  Component Value Date   HGBA1C 5.8* 03/13/2015   Urine Drug Screen:     Component Value Date/Time   LABOPIA NONE DETECTED 03/12/2015 1946   COCAINSCRNUR NONE DETECTED 03/12/2015 1946   LABBENZ NONE DETECTED 03/12/2015 1946   AMPHETMU NONE DETECTED 03/12/2015 1946   THCU NONE DETECTED 03/12/2015 1946   LABBARB NONE DETECTED 03/12/2015 1946      IMAGING I have personally reviewed the radiological images below and agree with the radiology interpretations.  Ct Head Wo Contrast 03/12/2015  Stable intraparenchymal hemorrhage involving the LEFT temporal lobe with no significant increase in size from earlier CT. 2.4 x 3.5 x 2.5 cm dimensions correspond to an approximate  volume of 10.5 mL.  03/12/2015   Acute intraparenchymal hemorrhage within the left temporoparietal lobe measuring up to 3.6 cm. There is mild mass effect on the adjacent left lateral ventricle. This is an atypical location and may be secondary to underlying vasculitis, hemorrhagic venous infarct or hemorrhagic metastasis. This needs dedicated evaluation with MRI.   Dg Chest Portable 1 View 03/12/2015  Endotracheal tube tip is in the lower trachea, 2.4 cm above the carina. No acute cardiopulmonary process suspected.   MRI with and without contrast 03/13/2015 - 4.6 x 2.7 x 3 cm left mid to posterior temporal lobe hematoma with fluid fluid level. Surrounding vasogenic edema. Engorgement of compressed surrounding veins without discrete mass seen separate from the hematoma. Mild mass effect upon the lateral aspect of the left lateral ventricle without midline shift. - Scattered blood breakdown products may reflect result of amyloid angiography which may be the source of the left temporal lobe hematoma. - Recommend followup until clearance to confirm that hematoma clears as expected for simple hematoma. - Global atrophy without hydrocephalus. - Moderate small vessel disease type changes.  MRA 03/13/2015 - No abnormal vessels seen surrounding the left temporal lobe hematoma. - Minimal bulge distal M1 segment left middle cerebral artery appears  to be origin of vessel without discrete aneurysm noted. - Minimal to mild intracranial atherosclerotic type changes as discussed above.  TTE -  - Left ventricle: The cavity size was normal. Wall thickness wasnormal. Systolic function was normal. The estimated ejectionfraction was in the range of 55% to 60%. Wall motion was normal;there were no regional wall motion abnormalities. Features areconsistent with a pseudonormal left ventricular filling pattern,with concomitant abnormal relaxation and increased fillingpressure (grade 2 diastolic  dysfunction). - Left atrium: The atrium was mildly dilated. - Atrial septum: There was increased thickness of the septum,consistent with lipomatous hypertrophy. Impressions: Technically limited study due to poor sound wavetransmission.  EEG - Impression: This sedated EEG is abnormal due to the presence of: 1. Occasional focal slowing over the left temporal region 2. Rare left temporal epileptiform discharges 3. Except for excess amount of diffuse low voltage beta activity. Clinical Correlation of the above findings indicates focal cerebral dysfunction over the left temporal region suggestive of underlying structural or physiologic abnormality with a possible tendency for seizures to arise from this region. Diffuse low voltage beta activity is commonly seen with sedating medications. There were no electrographic seizures seen in this study.    PHYSICAL EXAM General - Well nourished, well developed, no acute distress  Ophthalmologic - Fundi not visualized due to noncooperation.  Cardiovascular - Regular rate and rhythm.  Neuro- awake, alert and pleasant, able to follow most of the simple commands, but word salad, not able to name or repeat. PERRL, EOMI, attending to both sides, blink to visual threat bilaterally. Facial symmetrical and Tongue in middle. Moving all extremities spontaneously and symmetrically. DTR 1+ and no babinski. Sensation seems symmetrical, coordination and gait not tested.   ASSESSMENT/PLAN Shawna Banks is a 69 y.o. female with history of hypertension, hyperlipidemia, early stage of dementia on aricept presenting with speech difficulties and HA. CT show a L temporal lobe hemorrhage.  Stroke:  left tempoparietal lobe hemorrhage (surface hemorrhage with 2 separate times of bleeding) most likely secondary to Cerebral amyloid angiopathy  CT head L temporal lobe hemorrhage  Repeat CT with stable hemorrhage  MRI w/ & w/o  L temporal hemoatoma with vasogenic  edema, no separate mass. SWI with other cortical microbleeds, suggesting CAA  MRA  No AVM or aneurysm  2D Echo  No source of embolus   LDL 83  HgbA1c 5.8  SCDs for VTE prophylaxis   NPO - waiting for speech evaluation.  No antithrombotic prior to admission, due to CAA, no antiplatelet or anticoagulation recommended.  Therapy recommendations:  pending  Disposition:  pending  (lived alone PTA, cooked, cleaned, paid bills, Independent with ADLs)  Possible Seizure  In ED, unresponsive to voice or noxious stimuli with right gaze deviation, no tonic activity  most likely secondary to hemorrhage  Treated with ativan, received fosphenytoin  continue Keppra 1000mg  bid  EEG left temporal focal slowing with rare epileptiform discharges.   Hypertension/Hypotension  BP 180/97, 214/98 on arrival. As high as 193/113, as low as 71/58  Remains low this am 89/53, CCM feels due to propofol  Homed meds:  vasotec  BP goal < 140 first day and then < 180 after.  Resume home meds today after swallow  Hyperlipidemia  Home meds:  zocor 40, resumed in hospital  LDL 83, goal < 70  Resumed zocor today after swallow  Continue statin on discharge  Dementia at baseline  Fits to the picture of CAA  On aricept at home.  Has been  functioning independently in her own envirmonment  Other Stroke Risk Factors  Advanced age  Other Active Problems  Hypokalemia 2.7->3.1->3.1 after replacement yesterday with 4 runs, replaced again today. Check in am  Thrombocytopenia PLT 153->123->106  Hospital day # 2  Marvel Plan, MD PhD Stroke Neurology 03/14/2015 12:08 PM     To contact Stroke Continuity provider, please refer to [email or web address]. After hours, contact General Neurology

## 2015-03-14 NOTE — Evaluation (Signed)
Physical Therapy Evaluation Patient Details Name: Shawna Banks: [id number] DOB: [birth year] Today's Date: 03/14/2015   History of Present Illness  pt presents with L Temporoparietal ICH, which Neurology notes as two seperate times of bleeding.  pt intubated 11/20-11/21 and abnormal EEG results.  pt with hx of HTN and Dementia.    Clinical Impression  Pt moving well and only requiring Guarding level of A.  Difficult to assess pt's cognition vs communication deficits during mobility.  Pt performs better with gestural cues, but not consistent.  2 daughters present during session who indicate that family is able to provide 24hr S for pt.  Feel pt would benefit from OPPT for balance at D/C.  Will continue to follow.      Follow Up Recommendations Outpatient PT;Supervision/Assistance - 24 hour    Equipment Recommendations  None recommended by PT    Recommendations for Other Services       Precautions / Restrictions Precautions Precautions: Fall Restrictions Weight Bearing Restrictions: No      Mobility  Bed Mobility Overal bed mobility: Needs Assistance Bed Mobility: Supine to Sit;Sit to Supine     Supine to sit: Supervision Sit to supine: Supervision   General bed mobility comments: pt moves slowly and question if this is due to processing of direction instead of a mobility deficit.    Transfers Overall transfer level: Needs assistance Equipment used: None Transfers: Sit to/from Stand Sit to Stand: Min guard         General transfer comment: pt with definite use of UEs and again slow to complete task, but question if this is due to cognition/communication.    Ambulation/Gait Ambulation/Gait assistance: Min guard Ambulation Distance (Feet): 150 Feet Assistive device: None Gait Pattern/deviations: Step-through pattern;Decreased stride length;Narrow base of support     General Gait Details: pt moves slowly and with very guarded posture.  pt without LOB or need  for physical A.  Gestural cueing helpful.    Stairs            Wheelchair Mobility    Modified Rankin (Stroke Patients Only) Modified Rankin (Stroke Patients Only) Pre-Morbid Rankin Score: No significant disability Modified Rankin: Moderately severe disability     Balance Overall balance assessment: Needs assistance Sitting-balance support: No upper extremity supported;Feet supported Sitting balance-Leahy Scale: Good     Standing balance support: No upper extremity supported;During functional activity Standing balance-Leahy Scale: Fair                               Pertinent Vitals/Pain Pain Assessment: No/denies pain    Home Living Family/patient expects to be discharged to:: Private residence Living Arrangements: Alone   Type of Home: House Home Access: Stairs to enter Entrance Stairs-Rails: None Entrance Stairs-Number of Steps: couple Home Layout: One level Home Equipment: None      Prior Function Level of Independence: Independent         Comments: Help with driving and daughter would place pills in pill box each week.       Hand Dominance   Dominant Hand: Left    Extremity/Trunk Assessment   Upper Extremity Assessment: Defer to OT evaluation           Lower Extremity Assessment: RLE deficits/detail RLE Deficits / Details: Mild decreased strength grossly 4 - 4+/5.  Sensation intact.      Cervical / Trunk Assessment: Normal  Communication   Communication: Expressive difficulties  Cognition  Arousal/Alertness: Awake/alert Behavior During Therapy: WFL for tasks assessed/performed Overall Cognitive Status: Difficult to assess                      General Comments      Exercises        Assessment/Plan    PT Assessment Patient needs continued PT services  PT Diagnosis Difficulty walking   PT Problem List Decreased strength;Decreased activity tolerance;Decreased balance;Decreased mobility;Decreased  coordination;Decreased cognition;Decreased knowledge of use of DME;Decreased safety awareness  PT Treatment Interventions DME instruction;Gait training;Stair training;Functional mobility training;Therapeutic activities;Therapeutic exercise;Balance training;Neuromuscular re-education;Cognitive remediation;Patient/family education   PT Goals (Current goals can be found in the Care Plan section) Acute Rehab PT Goals Patient Stated Goal: Per daughters to be as independent as possible PT Goal Formulation: With patient/family Time For Goal Achievement: 03/21/15 Potential to Achieve Goals: Good    Frequency Min 4X/week   Barriers to discharge        Co-evaluation PT/OT/SLP Co-Evaluation/Treatment: Yes Reason for Co-Treatment: Necessary to address cognition/behavior during functional activity PT goals addressed during session: Mobility/safety with mobility;Balance;Strengthening/ROM         End of Session Equipment Utilized During Treatment: Gait belt Activity Tolerance: Patient tolerated treatment well Patient left: in bed;with call bell/phone within reach;with bed alarm set;with family/visitor present Nurse Communication: Mobility status         Time: 1610-9604 PT Time Calculation (min) (ACUTE ONLY): 32 min   Charges:   PT Evaluation $Initial PT Evaluation Tier I: 1 Procedure     PT G CodesSunny Banks, Shawna Banks Shawna Banks 03/14/2015, 10:55 AM

## 2015-03-15 DIAGNOSIS — F0390 Unspecified dementia without behavioral disturbance: Secondary | ICD-10-CM | POA: Diagnosis present

## 2015-03-15 DIAGNOSIS — D696 Thrombocytopenia, unspecified: Secondary | ICD-10-CM | POA: Diagnosis present

## 2015-03-15 LAB — CBC
HCT: 40.2 % (ref 36.0–46.0)
Hemoglobin: 13.4 g/dL (ref 12.0–15.0)
MCH: 31 pg (ref 26.0–34.0)
MCHC: 33.3 g/dL (ref 30.0–36.0)
MCV: 93.1 fL (ref 78.0–100.0)
Platelets: 111 K/uL — ABNORMAL LOW (ref 150–400)
RBC: 4.32 MIL/uL (ref 3.87–5.11)
RDW: 14.5 % (ref 11.5–15.5)
WBC: 7.8 K/uL (ref 4.0–10.5)

## 2015-03-15 LAB — BASIC METABOLIC PANEL WITH GFR
Anion gap: 11 (ref 5–15)
BUN: 10 mg/dL (ref 6–20)
CO2: 26 mmol/L (ref 22–32)
Calcium: 8.8 mg/dL — ABNORMAL LOW (ref 8.9–10.3)
Chloride: 103 mmol/L (ref 101–111)
Creatinine, Ser: 0.63 mg/dL (ref 0.44–1.00)
GFR calc Af Amer: >60 mL/min
GFR calc non Af Amer: >60 mL/min
Glucose, Bld: 86 mg/dL (ref 65–99)
Potassium: 3.3 mmol/L — ABNORMAL LOW (ref 3.5–5.1)
Sodium: 140 mmol/L (ref 135–145)

## 2015-03-15 MED ORDER — LEVETIRACETAM 1000 MG PO TABS: 1000.0000 mg | ORAL_TABLET | Freq: Two times a day (BID) | ORAL | Status: DC

## 2015-03-15 MED ORDER — POTASSIUM CHLORIDE CRYS ER 20 MEQ PO TBCR: 40.0000 meq | EXTENDED_RELEASE_TABLET | Freq: Once | ORAL | Status: DC

## 2015-03-15 NOTE — Progress Notes (Signed)
Patient d/c home. D/c instructions given to caregivers. Caregivers verbalized understanding. Consent for [name] obtained

## 2015-03-15 NOTE — Progress Notes (Signed)
Speech Language Pathology Treatment: Cognitive-Linquistic  Patient Details Name: Shawna FlockBarbara C Banks MRN: [id number] DOB: [birth year] Today's Date: 03/15/2015 Time: 1435-1450 SLP Time Calculation (min) (ACUTE ONLY): 15 min  Assessment / Plan / Recommendation Clinical Impression  Pt continues with persisting receptive/expressive aphasia.  Daughters present and preparing for D/C. Observed pt with PO consumption - tolerated liquids well with no s/s of aspiration.  Reviewed nature of aphasia, communication tips and ways to enhance communication interaction.  We discussed the need to minimize speakers, allow increased processing time through simplified speech and slower rate.  Pt requires mod cues - verbal and gestural- to follow instructions and answer basic questions.  She will benefit from intensive OP SLP upon D/C.  Family in agreement.    HPI HPI: pt presents with L Temporoparietal ICH, which Neurology notes as two separate times of bleeding. pt intubated 11/20-11/21 and abnormal EEG results. pt with hx of HTN and Dementia.       SLP Plan  Discharge SLP treatment due to (comment) (d/c home today)     Recommendations  Diet recommendations: Regular;Thin liquid              Plan: Discharge SLP treatment due to (comment) (d/c home today)   Blenda MountsCouture, Tiffanie Blassingame Laurice 03/15/2015, 2:52 PM

## 2015-03-15 NOTE — Care Management Note (Signed)
Case Management Note  Patient Details  Name: REBECCA CAIRNS MRN: [id number] Date of Birth: 07-04-1945  Subjective/Objective:                    Action/Plan: Orders placed for outpatient PT/OT/ST services. CM met with the patient and her family and they want to attend a rehab in the [place] area. CM asked the patient and her family if they were interested in the [facility] of [place]. Ms Muzyka and her family would like this outpatient rehab facility. CM contacted the facility and faxed them the information they requested. CM will update the bedside RN.   Expected Discharge Date:                  Expected Discharge Plan:  Home/Self Care  In-House Referral:     Discharge planning Services  CM Consult  Post Acute Care Choice:    Choice offered to:     DME Arranged:    DME Agency:     HH Arranged:    HH Agency:     Status of Service:  In process, will continue to follow  Medicare Important Message Given:    Date Medicare IM Given:    Medicare IM give by:    Date Additional Medicare IM Given:    Additional Medicare Important Message give by:     If discussed at [place] of Stay Meetings, dates discussed:    Additional Comments:  Pollie Friar, RN 03/15/2015, 1:26 PM

## 2015-03-15 NOTE — Discharge Summary (Signed)
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  Past Medical History  Diagnosis Date  . Hypertension   . Hypercholesteremia   . Dementia    History reviewed. No pertinent past surgical history.    Medication List    TAKE these medications        donepezil 10 MG tablet  Commonly known as:  ARICEPT  Take 10 mg by mouth at bedtime.     enalapril 20 MG tablet  Commonly known as:  VASOTEC  Take 20 mg by mouth daily.     levETIRAcetam 1000 MG tablet  Commonly known as:  KEPPRA  Take 1 tablet (1,000 mg total) by mouth 2 (two) times daily.     simvastatin 40 MG tablet  Commonly known as:  ZOCOR  Take 40 mg by mouth at bedtime.     Vitamin D (Ergocalciferol) 50000 UNITS Caps capsule  Commonly known as:  DRISDOL  Take 50,000 Units by mouth every 14 (fourteen) days. Every other Wednesday        LABORATORY STUDIES CBC    Component Value Date/Time   WBC 7.8 03/15/2015 0413   RBC 4.32 03/15/2015 0413   HGB 13.4 03/15/2015 0413   HCT 40.2 03/15/2015 0413   PLT 111* 03/15/2015 0413   MCV 93.1 03/15/2015 0413   MCH 31.0 03/15/2015 0413   MCHC 33.3 03/15/2015 0413   RDW 14.5 03/15/2015 0413   LYMPHSABS 1.0 03/12/2015 1655   MONOABS 0.4 03/12/2015 1655   EOSABS 0.1 03/12/2015 1655   BASOSABS 0.0 03/12/2015 1655   CMP    Component  Value Date/Time   NA 140 03/15/2015 0413   K 3.3* 03/15/2015 0413   CL 103 03/15/2015 0413   CO2 26 03/15/2015 0413   GLUCOSE 86 03/15/2015 0413   BUN 10 03/15/2015 0413   CREATININE 0.63 03/15/2015 0413   CALCIUM 8.8* 03/15/2015 0413   PROT 7.0 03/12/2015 1655   ALBUMIN 4.2 03/12/2015 1655   AST 19 03/12/2015 1655   ALT 11* 03/12/2015 1655   ALKPHOS 76 03/12/2015 1655   BILITOT 0.2* 03/12/2015 1655   GFRNONAA >60 03/15/2015 0413   GFRAA >60 03/15/2015 0413   COAGS Lab Results  Component Value Date   INR 1.14 03/12/2015   Lipid Panel    Component Value Date/Time   CHOL 139 03/13/2015 0806   TRIG 86 03/13/2015 0806   HDL 39* 03/13/2015 0806   CHOLHDL 3.6 03/13/2015 0806   VLDL 17 03/13/2015 0806   LDLCALC 83 03/13/2015 0806   HgbA1C  Lab Results  Component Value Date   HGBA1C 5.8* 03/13/2015   Urinalysis    Component Value Date/Time   COLORURINE YELLOW 03/12/2015 1947   APPEARANCEUR CLEAR 03/12/2015 1947   LABSPEC 1.014 03/12/2015 1947   PHURINE 6.5 03/12/2015 1947   GLUCOSEU NEGATIVE 03/12/2015 1947   HGBUR TRACE*  03/12/2015 1947   BILIRUBINUR NEGATIVE 03/12/2015 1947   KETONESUR NEGATIVE 03/12/2015 1947   PROTEINUR NEGATIVE 03/12/2015 1947   NITRITE NEGATIVE 03/12/2015 1947   LEUKOCYTESUR NEGATIVE 03/12/2015 1947   Urine Drug Screen     Component Value Date/Time   LABOPIA NONE DETECTED 03/12/2015 1946   COCAINSCRNUR NONE DETECTED 03/12/2015 1946   LABBENZ NONE DETECTED 03/12/2015 1946   AMPHETMU NONE DETECTED 03/12/2015 1946   THCU NONE DETECTED 03/12/2015 1946   LABBARB NONE DETECTED 03/12/2015 1946    Alcohol Level    Component Value Date/Time   ETH <5 03/12/2015 1655    SIGNIFICANT DIAGNOSTIC STUDIES Ct Head Wo Contrast 03/12/2015 Stable intraparenchymal hemorrhage involving the LEFT temporal lobe with no significant increase in size from earlier CT. 2.4 x 3.5 x 2.5 cm dimensions correspond to an approximate volume of 10.5 mL.   03/12/2015 Acute intraparenchymal hemorrhage within the left temporoparietal lobe measuring up to 3.6 cm. There is mild mass effect on the adjacent left lateral ventricle. This is an atypical location and may be secondary to underlying vasculitis, hemorrhagic venous infarct or hemorrhagic metastasis. This needs dedicated evaluation with MRI.   Dg Chest Portable 1 View 03/12/2015 Endotracheal tube tip is in the lower trachea, 2.4 cm above the carina. No acute cardiopulmonary process suspected.   MRI with and without contrast 03/13/2015 - 4.6 x 2.7 x 3 cm left mid to posterior temporal lobe hematoma with fluid fluid level. Surrounding vasogenic edema. Engorgement of compressed surrounding veins without discrete mass seen separate from the hematoma. Mild mass effect upon the lateral aspect of the left lateral ventricle without midline shift. - Scattered blood breakdown products may reflect result of amyloid angiography which may be the source of the left temporal lobe hematoma. - Recommend followup until clearance to confirm that hematoma clears as expected for simple hematoma. - Global atrophy without hydrocephalus. - Moderate small vessel disease type changes.  MRA 03/13/2015 - No abnormal vessels seen surrounding the left temporal lobe hematoma. - Minimal bulge distal M1 segment left middle cerebral artery appears to be origin of vessel without discrete aneurysm noted. - Minimal to mild intracranial atherosclerotic type changes as discussed above.  TTE -  - Left ventricle: The cavity size was normal. Wall thickness wasnormal. Systolic function was normal. The estimated ejectionfraction was in the range of 55% to 60%. Wall motion was normal;there were no regional wall motion abnormalities. Features areconsistent with a pseudonormal left ventricular filling pattern,with concomitant abnormal relaxation and increased fillingpressure (grade 2 diastolic dysfunction). - Left atrium:  The atrium was mildly dilated. - Atrial septum: There was increased thickness of the septum,consistent with lipomatous hypertrophy. Impressions: Technically limited study due to poor sound wavetransmission.  EEG - Impression: This sedated EEG is abnormal due to the presence of: 1. Occasional focal slowing over the left temporal region 2. Rare left temporal epileptiform discharges 3. Except for excess amount of diffuse low voltage beta activity. Clinical Correlation of the above findings indicates focal cerebral dysfunction over the left temporal region suggestive of underlying structural or physiologic abnormality with a possible tendency for seizures to arise from this region. Diffuse low voltage beta activity is commonly seen with sedating medications. There were no electrographic seizures seen in this study.      HISTORY OF PRESENT ILLNESS Shawna Banks is an 69 y.o. female patient who presented to an outside ER with symptoms of speech problems, headache earlier this evening. CT of the brain done at outside ER showed a  left parietal intracerebral hemorrhage. I spoke to the ER physician on phone. She appeared to have partial aphasia but had sustained antigravity strength in bilateral upper extremities, symmetric. After she arrived in our ER, she was noted to have worsening mental status. During the initial assessment in the triage, she is unable to cooperate with any neurological examination, unable to sustain antigravity strength. She was admitted for further evaluation and treatment.    HOSPITAL COURSE Ms. Shawna Banks is a 69 y.o. female with history of hypertension, hyperlipidemia, early stage of dementia on aricept presenting with speech difficulties and HA. CT show a L temporal lobe hemorrhage.  Stroke: left tempoparietal lobe hemorrhage (surface hemorrhage with 2 separate times of bleeding) most likely secondary to Cerebral amyloid angiopathy  CT head L temporal lobe  hemorrhage  Repeat CT with stable hemorrhage  MRI w/ & w/o L temporal hemoatoma with vasogenic edema, no separate mass. SWI with other cortical microbleeds, suggesting CAA  MRA No AVM or aneurysm  2D Echo No source of embolus  LDL 83  HgbA1c 5.8  No antithrombotic prior to admission, due to CAA, no antiplatelet or anticoagulation recommended.  Therapy recommendations: OP PT, OT and ST  Disposition: return home. Agreeable with twin daughters. (lived alone PTA, cooked, cleaned, paid bills, Independent with ADLs)  Possible Seizure  In ED, unresponsive to voice or noxious stimuli with right gaze deviation, no tonic activity  most likely secondary to hemorrhage  Treated with ativan, received fosphenytoin  continue Keppra 1000mg  bid  EEG left temporal focal slowing with rare epileptiform discharges.  Hypertension/Hypotension  BP 180/97, 214/98 on arrival. As high as 193/113, as low as 71/58  BP stabilized day of discharge  Homed meds: vasotec  Resume home meds   Hyperlipidemia  Home meds: zocor 40  LDL 83, goal < 70  Resumed zocor   Continue statin on discharge  Dementia at baseline  Fits to the picture of CAA  On aricept at home.  Has been functioning independently in her own envirmonment  Other Stroke Risk Factors  Advanced age  Other Active Problems  Hypokalemia 2.7->3.1->3.1->3.3 replaced  Thrombocytopenia PLT 153->123->106->111   DISCHARGE EXAM Blood pressure 125/75, pulse 102, temperature 98.1 F (36.7 C), temperature source Oral, resp. rate 20, height 5\' 5"  (1.651 m), weight 58.2 kg (128 lb 4.9 oz), SpO2 96 %. General - Well nourished, well developed, no acute distress  Ophthalmologic - Fundi not visualized due to noncooperation.  Cardiovascular - Regular rate and rhythm.  Neuro- awake, alert and pleasant, able to follow most of the simple commands, but word salad, not able to name or repeat. PERRL, EOMI, attending to both sides,  blink to visual threat bilaterally. Facial symmetrical and Tongue in middle. Moving all extremities spontaneously and symmetrically. DTR 1+ and no babinski. Sensation seems symmetrical, coordination and gait not tested.   Discharge Diet   Diet regular Room service appropriate?: Yes; Fluid consistency:: Thin liquids   DISCHARGE PLAN  Disposition:  Home   OP PT, OT and ST  Due to hemorrhage and risk of bleeding, do not take aspirin, aspirin-containing medications, or ibuprofen products  Follow-up Milana ObeyKNOWLTON,STEPHEN D, MD in 2 weeks.  Follow-up with Dr. Marvel PlanJindong Orion Mole, Stroke Clinic in 2 months.  35 minutes were spent preparing discharge.  Rhoderick MoodyBIBY,SHARON  [facility] See Amion for Pager information 03/15/2015 1:27 PM    I, the attending vascular neurologist, have personally obtained a history, examined the patient, evaluated laboratory data, individually viewed imaging studies  and agree with radiology interpretations.  Together with the NP/PA, we formulated the assessment and plan of care which reflects our mutual decision.  I have made any additions or clarifications directly to the above note and agree with the findings and plan as currently documented.    Marvel Plan, MD PhD Stroke Neurology 03/15/2015 3:49 PM

## 2015-03-15 NOTE — Progress Notes (Signed)
Occupational Therapy Treatment Patient Details Name: Shawna Banks MRN: [id number] DOB: [birth year] Today's Date: 03/15/2015    History of present illness pt presents with L Temporoparietal ICH, which Neurology notes as two seperate times of bleeding.  pt intubated 11/20-11/21 and abnormal EEG results.  pt with hx of HTN and Dementia.     OT comments  Pt performing toileting, grooming and UB dressing this visit with supervision to minimum assistance. Much less guarded with ambulation and pt able to pick up items from the floor.  Follow Up Recommendations  Outpatient OT;Supervision/Assistance - 24 hour    Equipment Recommendations  None recommended by OT    Recommendations for Other Services      Precautions / Restrictions Precautions Precautions: Fall Restrictions Weight Bearing Restrictions: No       Mobility Bed Mobility               General bed mobility comments: pt in chair  Transfers   Equipment used: None   Sit to Stand: Supervision         General transfer comment: no physical assistance, used hands minimally to stand    Balance                                   ADL Overall ADL's : Needs assistance/impaired     Grooming: Wash/dry hands;Wash/dry face;Brushing hair;Standing Grooming Details (indicate cue type and reason): visual and verbal cues to locate soap, pt able to sequence         Upper Body Dressing : Set up;Sitting Upper Body Dressing Details (indicate cue type and reason): front opening gown, assist to orient, pt able to tie independently     Toilet Transfer: Supervision/safety;Ambulation;Regular Toilet   Toileting- Architect and Hygiene: Supervision/safety;Sit to/from stand       Functional mobility during ADLs: Min guard General ADL Comments: Pt able to reach down to floor and pick up item without LOB.      Vision                     Perception     Praxis      Cognition    Behavior During Therapy: [facility] for tasks assessed/performed Overall Cognitive Status: Difficult to assess          Following Commands: Follows one step commands inconsistently (with multimodal cues)            Extremity/Trunk Assessment               Exercises     Shoulder Instructions       General Comments      Pertinent Vitals/ Pain       Pain Assessment: Faces Faces Pain Scale: No hurt  Home Living                                          Prior Functioning/Environment              Frequency Min 2X/week     Progress Toward Goals  OT Goals(current goals can now be found in the care plan section)  Progress towards OT goals: Progressing toward goals     Plan Discharge plan remains appropriate    Co-evaluation  End of Session Equipment Utilized During Treatment: Gait belt   Activity Tolerance Patient tolerated treatment well   Patient Left in chair;with call bell/phone within reach;with chair alarm set;with family/visitor present   Nurse Communication          Time: [id number] OT Time Calculation (min): 23 min  Charges: OT General Charges $OT Visit: 1 Procedure OT Treatments $Self Care/Home Management : 23-37 mins  Evern BioMayberry, Brinna Divelbiss Lynn 03/15/2015, 11:49 AM  [phone]

## 2015-03-15 NOTE — Progress Notes (Signed)
Physical Therapy Treatment Patient Details Name: Shawna FlockBarbara C Banks MRN: [id number] DOB: 07-13-1945 Today's Date: 03/15/2015    History of Present Illness pt presents with L Temporoparietal ICH, which Neurology notes as two seperate times of bleeding.  pt intubated 11/20-11/21 and abnormal EEG results.  pt with hx of HTN and Dementia.      PT Comments    Pt progressing towards physical therapy goals. Continues to require assist for ambulation with cognitive tasks added in. Educated daughters on safe negotiation of stairs upon return home. Will continue to follow.   Follow Up Recommendations  Outpatient PT;Supervision/Assistance - 24 hour     Equipment Recommendations  None recommended by PT    Recommendations for Other Services       Precautions / Restrictions Precautions Precautions: Fall Restrictions Weight Bearing Restrictions: No    Mobility  Bed Mobility               General bed mobility comments: pt in chair  Transfers Overall transfer level: Needs assistance Equipment used: None Transfers: Sit to/from Stand Sit to Stand: Supervision         General transfer comment: no physical assistance, used hands minimally to stand  Ambulation/Gait Ambulation/Gait assistance: Min guard;Min assist Ambulation Distance (Feet): 250 Feet Assistive device: None Gait Pattern/deviations: Step-through pattern;Decreased stride length;Trunk flexed Gait velocity: Decreased Gait velocity interpretation: Below normal speed for age/gender General Gait Details: Pt ambulated grossly at a min guard level. When pt was cued to search the halls for colors/objects, balance worsened, requiring min assist at times.    Stairs Stairs: Yes Stairs assistance: Min assist Stair Management: One rail Left;Alternating pattern;Step to pattern;Forwards Number of Stairs: 6 General stair comments: Pt was cued for step-to pattern. With step-over step pt was losing balance and requiring assist.  Family educated on safety with entering home at d/c.   Wheelchair Mobility    Modified Rankin (Stroke Patients Only) Modified Rankin (Stroke Patients Only) Pre-Morbid Rankin Score: No significant disability Modified Rankin: Moderately severe disability     Balance Overall balance assessment: Needs assistance Sitting-balance support: Feet supported;No upper extremity supported Sitting balance-Leahy Scale: Good     Standing balance support: No upper extremity supported;During functional activity Standing balance-Leahy Scale: Fair Standing balance comment: Assist required for dynamic activity with cognitive activity.                     Cognition Arousal/Alertness: Awake/alert Behavior During Therapy: WFL for tasks assessed/performed Overall Cognitive Status: Difficult to assess         Following Commands: Follows one step commands inconsistently (with multimodal cues)            Exercises      General Comments        Pertinent Vitals/Pain Pain Assessment: Faces Faces Pain Scale: No hurt    Home Living                      Prior Function            PT Goals (current goals can now be found in the care plan section) Acute Rehab PT Goals Patient Stated Goal: Per daughters to be as independent as possible PT Goal Formulation: With patient/family Time For Goal Achievement: 03/21/15 Potential to Achieve Goals: Good Progress towards PT goals: Progressing toward goals    Frequency  Min 4X/week    PT Plan Current plan remains appropriate    Co-evaluation  End of Session Equipment Utilized During Treatment: Gait belt Activity Tolerance: Patient tolerated treatment well Patient left: in chair;with call bell/phone within reach;with chair alarm set;with family/visitor present     Time: 4098-1191 PT Time Calculation (min) (ACUTE ONLY): 15 min  Charges:  $Gait Training: 8-22 mins                    G Codes:      Conni Slipper 2015/04/14, 2:48 PM   Conni Slipper, PT, DPT Acute Rehabilitation Services Pager: [phone]

## 2015-03-20 ENCOUNTER — Ambulatory Visit (HOSPITAL_COMMUNITY): Payer: Medicare Other | Attending: Nurse Practitioner | Admitting: Speech Pathology

## 2015-03-20 DIAGNOSIS — R413 Other amnesia: Secondary | ICD-10-CM | POA: Diagnosis present

## 2015-03-20 DIAGNOSIS — IMO0002 Reserved for concepts with insufficient information to code with codable children: Secondary | ICD-10-CM

## 2015-03-20 DIAGNOSIS — I69320 Aphasia following cerebral infarction: Secondary | ICD-10-CM | POA: Diagnosis not present

## 2015-03-20 NOTE — Therapy (Signed)
[facility] [address] [place], [place], [address] Phone: [phone]   Fax:  [phone]  Speech Language Pathology Evaluation  Patient Details  Name: Shawna Banks MRN: [id number] Date of Birth: 1945-09-23 Referring Provider: Pearlean Brownie  Encounter Date: 03/20/2015      End of Session - 03/20/15 1528    Visit Number 1   Number of Visits 16   Date for SLP Re-Evaluation 05/18/15   Authorization Type [employer] Medicare   SLP Start Time 1345   SLP Stop Time  1434   SLP Time Calculation (min) 49 min   Activity Tolerance Patient tolerated treatment well      Past Medical History  Diagnosis Date  . Hypertension   . Hypercholesteremia   . Dementia     No past surgical history on file.  There were no vitals filed for this visit.  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 No/denies            SLP Evaluation [facility] - 03/20/15 1521    SLP Visit Information   SLP Received On 03/20/15   Referring Provider Sethi   Onset Date 03/12/2015   Medical Diagnosis s/p CVA   General Information   Behavioral/Cognition WNL   Mobility Status ambulatory   Prior Functional Status   Cognitive/Linguistic Baseline Baseline deficits   Baseline deficit details h/o dementia but living alone with strong social/family supports, family helped with pill box but she managed her finances still   Type of Home House    Lives With Alone   Available Support Family   Education 11th grade   Vocation Retired   Pain Assessment   Pain Assessment No/denies pain   Cognition   Overall Cognitive Status Impaired/Different from baseline   Area of Impairment Awareness   Awareness Emergent   Memory Impaired   Memory Impairment Retrieval deficit;Decreased short term memory   Awareness Impaired   Awareness Impairment Emergent impairment   Executive Function Organizing;Decision Making;Self Monitoring;Self Correcting   Auditory Comprehension   Overall Auditory Comprehension Impaired   Yes/No Questions Within Functional Limits   Basic Biographical Questions 76-100% accurate   Basic Immediate Environment Questions 75-100% accurate   Commands Impaired   One Step Basic Commands 75-100% accurate   Complex Commands 50-74% accurate   Conversation Moderately complex   EffectiveTechniques Visual/Gestural cues   Visual Recognition/Discrimination   Discrimination Within Function Limits   Reading Comprehension   Reading Status Impaired   Word level 76-100% accurate   Sentence Level 76-100% accurate   Paragraph Level 51-75% accurate   Functional Environmental (signs, name badge) Within functional limits   Expression   Primary Mode of Expression Verbal   Verbal Expression   Overall Verbal Expression Impaired   Initiation No impairment   Level of Generative/Spontaneous Verbalization Sentence   Repetition Impaired   Level of Impairment Phrase level   Naming Impairment   Responsive 26-50% accurate   Confrontation 0-24% accurate   Convergent 0-24% accurate   Divergent 0-24% accurate   Verbal Errors Phonemic paraphasias;Neologisms;Perseveration;Aware of errors;Not aware of errors;Inconsistent   Pragmatics No impairment   Effective Techniques Phonemic cues;Written cues;Articulatory cues   Written Expression   Dominant Hand Left   Written Expression Exceptions to [facility]  Self Formulation Ability Phrase   Oral Motor/Sensory Function   Overall Oral Motor/Sensory Function Appears within functional limits for tasks assessed   Motor Speech   Overall Motor Speech Appears within functional limits for tasks assessed   Respiration Within functional limits   Phonation Normal   Resonance Within functional limits   Articulation Within functional limitis   Intelligibility  Intelligible   Motor Planning Witnin functional limits   Motor Speech Errors Not applicable   Phonation WFL   Standardized Assessments   Standardized Assessments  Western Aphasia Battery   Western Aphasia Battery  --  portions given   Assessment   Therapy Diagnosis Aphasia   Clinical Impression Statement (ACUTE ONLY)    SLP Recommendation/Assessment Patient needs continued Speech Lanaguage Pathology Services       SLP Short Term Goals - 03/20/15 1530    SLP SHORT TERM GOAL #1   Title Pt will increase confrontation naming for high frequency objects to 75% with mod cues from SLP   Baseline 50% mod cues   Time 4   Period Weeks   Status New   SLP SHORT TERM GOAL #2   Title Pt will be able to provide at least two descriptive characteristics of common word/proper names with 80% acc when given min cues.   Baseline 70% mod cues   Time 4   Period Weeks   Status New   SLP SHORT TERM GOAL #3   Title Pt will increase generational naming for concrete items to 3 items per category when given moderate cues.   Baseline 2 items max cues   Time 4   Period Weeks   Status New   SLP SHORT TERM GOAL #4   Title Pt will increase reading comprehension for short paragraphs to 80% acc via multiple choice format when given min cues.   Baseline 65%   Time 4   Period Weeks   Status New          SLP Long Term Goals - 03/20/15 1530    SLP LONG TERM GOAL #1   Title Pt will be able to express moderately complex thoughts/feelings to WNL with use of compensatory strategies for total communication.   Baseline moderate/max impairment   Time 2   Period Months   Status New           Plan - 03/20/15 1529    Clinical Impression Statement Pt presents with moderate expressive aphasia and mild receptive aphasia with improvements noted from her hospitalization a week ago. Pt primarily communicates in telegraphic sentences with frequent word omissions, substitutions, paraphasic utterances, and  neologisms mostly for nouns and content words. Her daughter reports frustration at home with communication, however during this evaluation the pt seemed only slightly bothered if not unaware of word finding difficulties and substitutions. Pt responded well to written and phonemic cues to aid in word retrieval. Pt was diagnosed with early onset dementia a few years ago per daughter, but has managed to lived independently and safely in her own home. Her daughter denies safety concerns in the kitchen prior to this stroke (she is staying with her mom presently). Reading is a relative strength for Ms. [name] so this will assist in communication and memory. Recommend skilled SLP therapy to address moderate expressive and mild receptive aphasia and also to implement memory strategies given diagnosis of dementia. Pt/family in agreement with POC.    Speech Therapy Frequency 2x / week   Duration --  8 weeks  Treatment/Interventions SLP instruction and feedback;Cueing hierarchy;Compensatory strategies;Patient/family education;Internal/external aids;Functional tasks;Multimodal communcation approach;Compensatory techniques   Potential to Achieve Goals Good   Potential Considerations Ability to learn/carryover information   Consulted and Agree with Plan of Care Patient;Family member/caregiver   Family Member Consulted daughter          G-Codes - 03-25-2015 1531    Functional Assessment Tool Used clinical judgment   Functional Limitations Spoken language expressive   Spoken Language Expression Current Status [phone]) At least 40 percent but less than 60 percent impaired, limited or restricted   Spoken Language Expression Goal Status (U0454) At least 1 percent but less than 20 percent impaired, limited or restricted      Problem List Patient Active Problem List   Diagnosis Date Noted  . Dementia 03/15/2015  . Thrombocytopenia (HCC) 03/15/2015  . Hypokalemia 03/14/2015  . Seizures (HCC)   . Cerebral  amyloid angiopathy   . HLD (hyperlipidemia)   . Essential hypertension   . 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 Proximal humerus fracture 02/28/2014   Thank you,  Havery Moros, CCC-SLP [phone]  [facility] 03-25-15, 3:33 PM  [facility] [facility] [address] [place], [place], [address] Phone: [phone]   Fax:  [phone]  Name: Shawna Banks MRN: [id number] Date of Birth: 1945-05-27

## 2015-03-21 ENCOUNTER — Ambulatory Visit (HOSPITAL_COMMUNITY): Payer: Medicare Other | Admitting: Speech Pathology

## 2015-03-21 DIAGNOSIS — R413 Other amnesia: Secondary | ICD-10-CM

## 2015-03-21 DIAGNOSIS — I69320 Aphasia following cerebral infarction: Secondary | ICD-10-CM | POA: Diagnosis not present

## 2015-03-21 DIAGNOSIS — IMO0002 Reserved for concepts with insufficient information to code with codable children: Secondary | ICD-10-CM

## 2015-03-21 NOTE — Therapy (Signed)
[facility] [address] [place], [place], [address] Phone: [phone]   Fax:  [phone]  Speech Language Pathology Treatment  Patient Details  Name: Shawna Banks MRN: [id number] Date of Birth: [birth year] Referring Provider: Pearlean BrownieSethi  Encounter Date: 03/21/2015      End of Session - 03/21/15 1840    Visit Number 2   Number of Visits 16   Date for SLP Re-Evaluation 05/18/15   Authorization Type [employer] Medicare   SLP Start Time 1346   SLP Stop Time  1431   SLP Time Calculation (min) 45 min   Activity Tolerance Patient tolerated treatment well      Past Medical History  Diagnosis Date  . Hypertension   . Hypercholesteremia   . Dementia     No past surgical history on file.  There were no vitals filed for this visit.  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 No/denies           [facility] - 03/20/15 1521    SLP Visit Information   SLP Received On 03/20/15   Referring Provider Sethi   Onset Date 03/12/2015   Medical Diagnosis s/p CVA   General Information   Behavioral/Cognition WNL   Mobility Status ambulatory   Prior Functional Status   Cognitive/Linguistic Baseline Baseline deficits   Baseline deficit details h/o dementia but living alone with strong social/family supports, family helped with pill box but she managed her finances still   Type of Home House    Lives With Alone   Available Support Family   Education 11th grade   Vocation Retired   Pain Assessment   Pain Assessment No/denies pain   Cognition   Overall Cognitive Status Impaired/Different from baseline   Area of Impairment Awareness   Awareness Emergent   Memory Impaired   Memory Impairment Retrieval deficit;Decreased short term memory   Awareness Impaired   Awareness Impairment Emergent impairment   Executive Function  Organizing;Decision Making;Self Monitoring;Self Correcting   Auditory Comprehension   Overall Auditory Comprehension Impaired   Yes/No Questions Within Functional Limits   Basic Biographical Questions 76-100% accurate   Basic Immediate Environment Questions 75-100% accurate   Commands Impaired   One Step Basic Commands 75-100% accurate   Complex Commands 50-74% accurate   Conversation Moderately complex   EffectiveTechniques Visual/Gestural cues   Visual Recognition/Discrimination   Discrimination Within Function Limits   Reading Comprehension   Reading Status Impaired   Word level 76-100% accurate   Sentence Level 76-100% accurate   Paragraph Level 51-75% accurate   Functional Environmental (signs, name badge) Within functional limits   Expression   Primary Mode of Expression Verbal   Verbal Expression   Overall Verbal Expression Impaired   Initiation No impairment   Level of Generative/Spontaneous Verbalization Sentence   Repetition Impaired   Level of Impairment Phrase level   Naming Impairment   Responsive 26-50% accurate   Confrontation 0-24% accurate   Convergent 0-24% accurate   Divergent 0-24% accurate   Verbal Errors Phonemic paraphasias;Neologisms;Perseveration;Aware of errors;Not aware of errors;Inconsistent   Pragmatics No impairment   Effective Techniques Phonemic cues;Written cues;Articulatory cues   Written Expression   Dominant Hand Left   Written Expression Exceptions to [facility]   Self Formulation Ability Phrase   Oral Motor/Sensory Function   Overall Oral Motor/Sensory Function Appears  within functional limits for tasks assessed   Motor Speech   Overall Motor Speech Appears within functional limits for tasks assessed   Respiration Within functional limits   Phonation Normal   Resonance Within functional limits   Articulation Within functional limitis   Intelligibility Intelligible   Motor Planning Witnin functional limits   Motor Speech Errors Not  applicable   Phonation WFL   Standardized Assessments   Standardized Assessments  Western Aphasia Battery   Western Aphasia Battery  --  portions given   Assessment   Therapy Diagnosis Aphasia   Clinical Impression Statement (ACUTE ONLY) Pt has a receptive and expressive aphasia with moderate-severe impairments. She does not reliably answer basic biographical or environmental yes/no questions, although she does follow simple one-step commands with Mod gestural cues. Max cues were provided for receptive identification of common objects in a field of two and for repetition at the word level during confrontational naming trials. Pt will need acute SLP f/u during hospital stay, as well as 24/7 supervision and OP SLP upon discharge.    SLP Recommendation/Assessment Patient needs continued Speech Lanaguage Pathology Services            ADULT SLP TREATMENT - 03/21/15 1837    General Information   Behavior/Cognition Alert;Cooperative;Pleasant mood   Patient Positioning Upright in chair   Oral care provided N/A   HPI HPI: pt presents with L Temporoparietal ICH, which Neurology notes as two separate times of bleeding. pt intubated 11/20-11/21 and abnormal EEG results. pt with hx of HTN and Dementia.    Treatment Provided   Treatment provided Cognitive-Linquistic   Pain Assessment   Pain Assessment No/denies pain   Cognitive-Linquistic Treatment   Treatment focused on Aphasia;Patient/family/caregiver education   Skilled Treatment confrontation naming, single word sentence completion, generational naming, word finding strategies   Assessment / Recommendations / Plan   Plan Continue with current plan of care          SLP Education - 03/21/15 1840    Education provided Yes   Education Details word finding strategies, use of folder for HEP   Person(s) Educated Patient   Methods Explanation;Demonstration;Handout   Comprehension Verbalized understanding;Need further instruction           SLP Short Term Goals - 03/21/15 1847    SLP SHORT TERM GOAL #1   Title Pt will increase confrontation naming for high frequency objects to 75% with mod cues from SLP   Baseline 50% mod cues   Time 4   Period Weeks   Status New   SLP SHORT TERM GOAL #2   Title Pt will be able to provide at least two descriptive characteristics of common word/proper names with 80% acc when given min cues.   Baseline 70% mod cues   Time 4   Period Weeks   Status New   SLP SHORT TERM GOAL #3   Title Pt will increase generational naming for concrete items to 3 items per category when given moderate cues.   Baseline 2 items max cues   Time 4   Period Weeks   Status New   SLP SHORT TERM GOAL #4   Title Pt will increase reading comprehension for short paragraphs to 80% acc via multiple choice format when given min cues.   Baseline 65%   Time 4   Period Weeks   Status New          SLP Long Term Goals - 03/21/15 1847    SLP LONG  TERM GOAL #1   Title Pt will be able to express moderately complex thoughts/feelings to WNL with use of compensatory strategies for total communication.   Baseline moderate/max impairment   Time 2   Period Months   Status New          Plan - 03/21/15 1841    Clinical Impression Statement Evaluation and goals reviewed with pt. Pt continues to appear unbothered by word finding difficulties; speech very telegraphic, but when encouraged she does an excellent job of using gestural cues and description cues. She completed confrontation naming tasks with 40% acc without cues and increased to 70% when given mod/max cues. She was only able to name two animals when given mild/mod cues. She did well with object naming tasks when provided letter space cues with half of the letters provided. Pt given folder today for HEP.   Speech Therapy Frequency 2x / week   Duration --  8 weeks   Treatment/Interventions SLP instruction and feedback;Cueing hierarchy;Compensatory  strategies;Patient/family education;Internal/external aids;Functional tasks;Multimodal communcation approach;Compensatory techniques   Potential to Achieve Goals Good   Potential Considerations Ability to learn/carryover information   Consulted and Agree with Plan of Care Patient;Family member/caregiver   Family Member Consulted daughter          G-Codes - 04-05-2015 1531    Functional Assessment Tool Used clinical judgment   Functional Limitations Spoken language expressive   Spoken Language Expression Current Status [phone]) At least 40 percent but less than 60 percent impaired, limited or restricted   Spoken Language Expression Goal Status (Z3086) At least 1 percent but less than 20 percent impaired, limited or restricted      Problem List Patient Active Problem List   Diagnosis Date Noted  . Dementia 03/15/2015  . Thrombocytopenia (HCC) 03/15/2015  . Hypokalemia 03/14/2015  . Seizures (HCC)   . Cerebral amyloid angiopathy   . HLD (hyperlipidemia)   . Essential hypertension   . 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 Proximal humerus fracture 02/28/2014   Thank you,  Shawna Banks, Shawna Banks [phone]  [facility] 03/21/2015, 6:48 PM  [place] [facility] [address] [place], [place], [address] Phone: [phone]   Fax:  [phone]   Name: Shawna Banks MRN: [id number] Date of Birth: 05/26/1945

## 2015-03-26 ENCOUNTER — Emergency Department (HOSPITAL_COMMUNITY): Payer: Medicare Other

## 2015-03-26 ENCOUNTER — Encounter (HOSPITAL_COMMUNITY): Payer: Self-pay

## 2015-03-26 ENCOUNTER — Emergency Department (HOSPITAL_COMMUNITY)
Admission: EM | Admit: 2015-03-26 | Discharge: 2015-03-26 | Disposition: A | Discharge status: Home / Self Care | Payer: Medicare Other | Attending: Emergency Medicine | Admitting: Emergency Medicine

## 2015-03-26 DIAGNOSIS — E7800 Pure hypercholesterolemia, unspecified: Secondary | ICD-10-CM | POA: Diagnosis not present

## 2015-03-26 DIAGNOSIS — R05 Cough: Secondary | ICD-10-CM | POA: Insufficient documentation

## 2015-03-26 DIAGNOSIS — Z8673 Personal history of transient ischemic attack (TIA), and cerebral infarction without residual deficits: Secondary | ICD-10-CM | POA: Diagnosis not present

## 2015-03-26 DIAGNOSIS — I10 Essential (primary) hypertension: Secondary | ICD-10-CM | POA: Insufficient documentation

## 2015-03-26 DIAGNOSIS — R059 Cough, unspecified: Secondary | ICD-10-CM

## 2015-03-26 DIAGNOSIS — F0390 Unspecified dementia without behavioral disturbance: Secondary | ICD-10-CM | POA: Diagnosis not present

## 2015-03-26 DIAGNOSIS — R079 Chest pain, unspecified: Secondary | ICD-10-CM | POA: Diagnosis not present

## 2015-03-26 DIAGNOSIS — Z79899 Other long term (current) drug therapy: Secondary | ICD-10-CM | POA: Insufficient documentation

## 2015-03-26 HISTORY — DX: Cerebral infarction, unspecified: I63.9

## 2015-03-26 LAB — CBC WITH DIFFERENTIAL/PLATELET
BASOS ABS: 0 10*3/uL (ref 0.0–0.1)
Basophils Relative: 0 %
EOS ABS: 0.1 10*3/uL (ref 0.0–0.7)
EOS PCT: 1 %
HCT: 40.5 % (ref 36.0–46.0)
HEMOGLOBIN: 13.4 g/dL (ref 12.0–15.0)
LYMPHS ABS: 1 10*3/uL (ref 0.7–4.0)
Lymphocytes Relative: 12 %
MCH: 30.6 pg (ref 26.0–34.0)
MCHC: 33.1 g/dL (ref 30.0–36.0)
MCV: 92.5 fL (ref 78.0–100.0)
Monocytes Absolute: 0.8 10*3/uL (ref 0.1–1.0)
Monocytes Relative: 9 %
NEUTROS PCT: 78 %
Neutro Abs: 6.2 10*3/uL (ref 1.7–7.7)
PLATELETS: 285 10*3/uL (ref 150–400)
RBC: 4.38 MIL/uL (ref 3.87–5.11)
RDW: 14.4 % (ref 11.5–15.5)
WBC: 8.1 10*3/uL (ref 4.0–10.5)

## 2015-03-26 LAB — BASIC METABOLIC PANEL
Anion gap: 11 (ref 5–15)
BUN: 20 mg/dL (ref 6–20)
CHLORIDE: 98 mmol/L — AB (ref 101–111)
CO2: 29 mmol/L (ref 22–32)
CREATININE: 0.78 mg/dL (ref 0.44–1.00)
Calcium: 9.3 mg/dL (ref 8.9–10.3)
Glucose, Bld: 116 mg/dL — ABNORMAL HIGH (ref 65–99)
POTASSIUM: 3.6 mmol/L (ref 3.5–5.1)
SODIUM: 138 mmol/L (ref 135–145)

## 2015-03-26 LAB — URINALYSIS, ROUTINE W REFLEX MICROSCOPIC
Bilirubin Urine: NEGATIVE
Glucose, UA: NEGATIVE mg/dL
Hgb urine dipstick: NEGATIVE
Ketones, ur: NEGATIVE mg/dL
LEUKOCYTES UA: NEGATIVE
NITRITE: NEGATIVE
PROTEIN: NEGATIVE mg/dL
pH: 6 (ref 5.0–8.0)

## 2015-03-26 NOTE — ED Provider Notes (Signed)
CSN: [id number]     Arrival date & time 03/26/15  0223 History   First MD Initiated Contact with Patient 03/26/15 0245     Chief Complaint  Patient presents with  . Cough     Patient is a 69 y.o. female presenting with cough. The history is provided by the patient and a relative.  Cough Cough characteristics:  Non-productive Severity:  Moderate Onset quality:  Gradual Duration: several weeks. Timing:  Intermittent Progression:  Unchanged Chronicity:  Recurrent Relieved by:  Nothing Worsened by:  Nothing tried Associated symptoms: no fever and no shortness of breath   Associated symptoms comment:  Chest soreness  pt with cough for several weeks after recent hospital admission Pt was admitted for hemorrhagic stroke (non-operative) and was intubated during hospital course She has been coughing ever since she was extubated She reports chest soreness from cough Tonight family reports she had less energy No significant SOB No fever/vomiting   Past Medical History  Diagnosis Date  . Hypertension   . Hypercholesteremia   . Dementia   . Stroke [employer](HCC)    History reviewed. No pertinent past surgical history. No family history on file. Social History  Substance Use Topics  . Smoking status: Never Smoker   . Smokeless tobacco: None  . Alcohol Use: No   OB History    No data available     Review of Systems  Constitutional: Negative for fever.  Respiratory: Positive for cough. Negative for shortness of breath.   All other systems reviewed and are negative.     Allergies  Review of patient's allergies indicates no known allergies.  Home Medications   Prior to Admission medications   Medication Sig Start Date End Date Taking? Authorizing Provider  donepezil (ARICEPT) 10 MG tablet Take 10 mg by mouth at bedtime.    Historical Provider, MD  enalapril (VASOTEC) 20 MG tablet Take 20 mg by mouth daily.    Historical Provider, MD  levETIRAcetam (KEPPRA) 1000 MG tablet Take  1 tablet (1,000 mg total) by mouth 2 (two) times daily. 03/15/15   Layne BentonSharon L Biby, NP  simvastatin (ZOCOR) 40 MG tablet Take 40 mg by mouth at bedtime.     Historical Provider, MD  Vitamin D, Ergocalciferol, (DRISDOL) 50000 UNITS CAPS capsule Take 50,000 Units by mouth every 14 (fourteen) days. Every other Wednesday    Historical Provider, MD   BP 139/74 mmHg  Pulse 94  Temp(Src) 97.9 F (36.6 C) (Oral)  Ht 5\' 3"  (1.6 m)  Wt 57.153 kg  BMI 22.33 kg/m2  SpO2 97% Physical Exam CONSTITUTIONAL: Well developed/well nourished HEAD: Normocephalic/atraumatic EYES: EOMI/PERRL ENMT: Mucous membranes moist NECK: supple no meningeal signs SPINE/BACK:entire spine nontender CV: S1/S2 noted, no murmurs/rubs/gallops noted LUNGS: Lungs are clear to auscultation bilaterally, no apparent distress ABDOMEN: soft, nontender, no rebound or guarding, bowel sounds noted throughout abdomen NEURO: Pt is awake/alert/appropriate, moves all extremitiesx4.  No facial droop.   EXTREMITIES: pulses normal/equal, full ROM SKIN: warm, color normal PSYCH: no abnormalities of mood noted, alert and oriented to situation  ED Course  Procedures  3:41 AM Pt here with cough ever since hospital discharge She is well appearing  Only residual deficits is dysarthria that is improving She does not appear septic CXR/labs pending at this time 5:29 AM Labs unremarkable CXR negative Pt well appearing, no distress Stable for d/c home She is on ACE inhibitor which can trigger cough, advised need for PCP followup  Labs Review Labs Reviewed  BASIC METABOLIC  PANEL - Abnormal; Notable for the following:    Chloride 98 (*)    Glucose, Bld 116 (*)    All other components within normal limits  URINALYSIS, ROUTINE W REFLEX MICROSCOPIC (NOT AT [facility]) - Abnormal; Notable for the following:    Specific Gravity, Urine <1.005 (*)    All other components within normal limits  CBC WITH DIFFERENTIAL/PLATELET    Imaging Review Dg  Chest 2 View  03/26/2015  CLINICAL DATA:  Worsening nonproductive cough EXAM: CHEST  2 VIEW COMPARISON:  03/12/2015 FINDINGS: There is hyperinflation and upper lobe emphysematous change. No confluent airspace consolidation. No effusion. Hilar, mediastinal and cardiac contours are unremarkable and unchanged. IMPRESSION: Hyperinflation and upper lobe emphysematous changes. No acute cardiopulmonary findings. Electronically Signed   By: Ellery Plunk M.D.   On: 03/26/2015 04:27   I have personally reviewed and evaluated these images and lab results as part of my medical decision-making.    MDM   Final diagnoses:  Cough    Nursing notes including past medical history and social history reviewed and considered in documentation xrays/imaging reviewed by myself and considered during evaluation Labs/vital reviewed myself and considered during evaluation Previous records reviewed and considered     Zadie Rhine, MD 03/26/15 0530

## 2015-03-26 NOTE — ED Notes (Signed)
[facility] after having a stroke 2 weeks ago.  Per family, cough has worsened, is dry sounding.  Pt denies pain or sob

## 2015-03-26 NOTE — ED Notes (Signed)
Dr Wickline at bedside,  

## 2015-03-26 NOTE — Discharge Instructions (Signed)

## 2015-03-28 ENCOUNTER — Telehealth (HOSPITAL_COMMUNITY): Payer: Self-pay | Admitting: Speech Pathology

## 2015-03-28 NOTE — Telephone Encounter (Signed)
She has another MD Apptment and can not be here

## 2015-03-29 ENCOUNTER — Encounter (HOSPITAL_COMMUNITY): Payer: Medicare Other | Admitting: Speech Pathology

## 2015-03-30 ENCOUNTER — Ambulatory Visit (HOSPITAL_COMMUNITY): Payer: Medicare Other | Admitting: Physical Therapy

## 2015-03-30 ENCOUNTER — Ambulatory Visit (HOSPITAL_COMMUNITY): Payer: Medicare Other | Attending: Nurse Practitioner | Admitting: Occupational Therapy

## 2015-03-30 ENCOUNTER — Encounter (HOSPITAL_COMMUNITY): Payer: Self-pay | Admitting: Occupational Therapy

## 2015-03-30 DIAGNOSIS — I69320 Aphasia following cerebral infarction: Secondary | ICD-10-CM | POA: Insufficient documentation

## 2015-03-30 DIAGNOSIS — R413 Other amnesia: Secondary | ICD-10-CM | POA: Diagnosis present

## 2015-03-30 DIAGNOSIS — M6281 Muscle weakness (generalized): Secondary | ICD-10-CM | POA: Diagnosis present

## 2015-03-30 DIAGNOSIS — I639 Cerebral infarction, unspecified: Secondary | ICD-10-CM

## 2015-03-30 NOTE — Patient Instructions (Signed)
   BRIDGING  While lying on your back, tighten your lower abdominals, squeeze your buttocks and then raise your buttocks off the floor/bed as creating a "Bridge" with your body.  Repeat 10 times, twice a day.    STRAIGHT LEG RAISE - SLR  While lying or sitting, raise up your leg with a straight knee.  Keep the opposite knee bent with the foot planted to the ground.  Repeat 10 times each side, twice a day.    HIP ABDUCTION - STANDING   While standing, raise your leg out to the side. Keep your knee straight and maintain your toes pointed forward the entire time.    Use your arms for support if needed for balance and safety.   Repeat 10 times each side, twice a day.

## 2015-03-30 NOTE — Therapy (Signed)
[facility] [address] [place], [place], [address] Phone: [phone]   Fax:  [phone]  Occupational Therapy Evaluation  Patient Details  Name: Shawna Banks MRN: [id number] Date of Birth: [birth year] Referring Provider: Annie MainSharon Biby, NP  Encounter Date: 03/30/2015      OT End of Session - 03/30/15 1726    Visit Number 1   Number of Visits 1   Date for OT Re-Evaluation 05/29/15   Authorization Type UHC Medicare   Authorization Time Period Before 10th visit   Authorization - Visit Number 1   Authorization - Number of Visits 10   OT Start Time 0930   OT Stop Time 1015   OT Time Calculation (min) 45 min   Activity Tolerance Patient tolerated treatment well   Behavior During Therapy [facility] for tasks assessed/performed      Past Medical History  Diagnosis Date  . Hypertension   . Hypercholesteremia   . Dementia   . Stroke [facility](HCC)     No past surgical history on file.  There were no vitals filed for this visit.  Visit Diagnosis:  Cerebrovascular accident (CVA), unspecified mechanism (HCC)      Subjective Assessment - 03/30/15 1721    Subjective  S: I had a stroke?   Pertinent History Pt is a 69 y/o female s/p CVA with hx of dementia at baseline. Pt had CVA on 03/12/15, she does not remember having stroke or being in the hospital due to dementia. Information obtained is limited due to pt dementia and family not present. Dr. Annie MainSharon Biby referred pt to occupational therapy for evaluation and treatment.    Patient Stated Goals none stated   Currently in Pain? No/denies           [facility] OT Assessment - 03/30/15 0933    Assessment   Diagnosis s/p CVA   Referring Provider Annie MainSharon Biby, NP   Onset Date 03/12/15   Prior Therapy OT[facility]PT/SLP [facility]   Precautions   Precaution Comments dementia   Balance Screen   Has the patient fallen in the past 6 months No   Has the patient had a decrease in activity level because of  a fear of falling?  No   Is the patient reluctant to leave their home because of a fear of falling?  No   Home  Environment   Family/patient expects to be discharged to: Private residence   Living Arrangements Alone   Available Help at Discharge Available 24 hours/day   Type of Home House   Bathroom Shower/Tub Tub/Shower unit   Allied Waste IndustriesBathroom Toilet Standard   Home Equipment Tub bench;Bedside commode;Grab bars - tub/shower   Prior Function   Level of Independence Independent with basic ADLs   Vocation Retired   Leisure reading, walking   ADL   Eating/Feeding Environmental managerndependent   Grooming Independent   Lower Body Bathing Independent   Upper Body Dressing Independent   Lower Body Dressing Independent   Toilet Tranfer Independent   Toileting - Conservator, museum/galleryClothing Manipulation Independent   Toileting -  Tour managerHygiene Independent   Tub/Shower Transfer Independent   ADL comments ADL comments are per pt report; some tasks demonstrated during evaluation.    IADL   Shopping Assistance for transportation;Needs to be accompanied on any shopping trip  at baseline    Light Housekeeping Maintains house alone or with occasional assistance;Does personal laundry completely   Meal Prep Prepares adequate meal if supplied with ingredients  daughters supervise  Community Mobility Relies on family or friends for transportation   Medication Management Is responsible for taking medication in correct dosages at correct time  daughters prepare weekly pill box   Financial Management Manages day-to-day purchases, but needs help with banking, major purchases, etc.   Written Expression   Dominant Hand Left   Coordination   Gross Motor Movements are Fluid and Coordinated Yes   Fine Motor Movements are Fluid and Coordinated Yes   9 Hole Peg Test Right;Left   Right 9 Hole Peg Test 25.90"   Left 9 Hole Peg Test 28.27"   ROM / Strength   AROM / PROM / Strength AROM;Strength   AROM   Overall AROM  Within functional limits for tasks  performed   Strength   Overall Strength Within functional limits for tasks performed   Strength Assessment Site Hand;Shoulder   Right/Left hand Left;Right   Right Hand Grip (lbs) 56   Right Hand Lateral Pinch 8 lbs   Right Hand 3 Point Pinch 10 lbs   Left Hand Grip (lbs) 62   Left Hand Lateral Pinch 14 lbs   Left Hand 3 Point Pinch 10 lbs                                     Plan - Apr 20, 2015 1726    Clinical Impression Statement A: Pt is a 69 y/o female s/p CVA on 03/12/15. Pt does not remember having stroke due to dementia at baseline. Pt demonstrates abiilty to perform all ADL tasks with modified independence and requires supervision for IADL tasks at baseline due to cognitive deficits. No skilled OT services indicated at this time.    Rehab Potential Good   OT Frequency 1x / week   OT Duration --  1 visit   OT Treatment/Interventions Patient/family education   Plan P: No skilled OT services indicated at this time. Pt requires supervision for IADL tasks due to dementia.    Consulted and Agree with Plan of Care Patient          G-Codes - Apr 20, 2015 1732    Functional Assessment Tool Used clinical judgement   Functional Limitation Self care   Self Care Current Status [phone]) At least 20 percent but less than 40 percent impaired, limited or restricted   Self Care Goal Status (U0454) At least 20 percent but less than 40 percent impaired, limited or restricted   Self Care Discharge Status [phone]) At least 20 percent but less than 40 percent impaired, limited or restricted      Problem List Patient Active Problem List   Diagnosis Date Noted  . Dementia 03/15/2015  . Thrombocytopenia (HCC) 03/15/2015  . Hypokalemia 03/14/2015  . Seizures (HCC)   . Cerebral amyloid angiopathy   . HLD (hyperlipidemia)   . Essential hypertension   . 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 Proximal humerus fracture  02/28/2014   Ezra Sites, OTR/L  [phone]  20-Apr-2015, 5:33 PM  [facility] [address] [place], [place], [address] Phone: [phone]   Fax:  [phone]  Name: Shawna Banks MRN: [id number] Date of Birth: November 12, 1945

## 2015-03-30 NOTE — Therapy (Signed)
[facility] [address] [place], [place], [address] Phone: [phone]   Fax:  [phone]  Physical Therapy Evaluation  Patient Details  Name: Shawna Banks MRN: [id number] Date of Birth: 06/22/1945 Referring Provider: Layne Benton NP   Encounter Date: 03/30/2015      PT End of Session - 03/30/15 1351    Visit Number 1   Number of Visits 1   Authorization Type [facility] AARP Complete    Authorization Time Period 03/30/15 to 05/31/15   Authorization - Visit Number 1   Authorization - Number of Visits 10   PT Start Time 1019   PT Stop Time 1058   PT Time Calculation (min) 39 min   Activity Tolerance Patient tolerated treatment well   Behavior During Therapy [facility] for tasks assessed/performed      Past Medical History  Diagnosis Date  . Hypertension   . Hypercholesteremia   . Dementia   . Stroke [facility])     No past surgical history on file.  There were no vitals filed for this visit.  Visit Diagnosis:  Cerebrovascular accident (CVA), unspecified mechanism (HCC) - Plan: PT plan of care cert/re-cert  Muscle weakness - Plan: PT plan of care cert/re-cert      Subjective Assessment - 03/30/15 1022    Subjective Patient reports that she doesn't notice feeling weak, unsteady, or any new changes, doesn't feel any different than she did before    Pertinent History Patient does have dementia and reports that she didn't know she had a stroke; per medical notes in EPIC, stroke happened in November. Patient only able to provide limited information due to dementia.    Currently in Pain? No/denies            [facility] PT Assessment - 03/30/15 1025    Assessment   Medical Diagnosis s/p CVA    Referring Provider Layne Benton NP    Onset Date/Surgical Date --  Shannan Harper 2016   Next MD Visit patient cannot recall    Precautions   Precaution Comments dementia    Balance Screen   Has the patient fallen in the past 6 months No   Has the  patient had a decrease in activity level because of a fear of falling?  No   Is the patient reluctant to leave their home because of a fear of falling?  No   Prior Function   Level of Independence Independent with basic ADLs   Vocation Retired   Leisure reading, walking   Strength   Right Hip Extension 4/5   Right Hip ABduction 3+/5   Left Hip Extension 4-/5   Left Hip ABduction 4/5   Right Knee Flexion 4-/5   Right Knee Extension 4/5   Left Knee Flexion 4-/5   Left Knee Extension 4/5   Right Ankle Dorsiflexion 4-/5   Left Ankle Dorsiflexion 4-/5   Bed Mobility   Rolling Right 6: Modified independent (Device/Increase time)   Rolling Left 6: Modified independent (Device/Increase time)   Supine to Sit 7: Independent   Sit to Supine 7: Independent   Transfers   Five time sit to stand comments  12.78   Ambulation/Gait   Gait Comments proximal msucle weakness, reduced TKE and step lenghts bilaterally    6 minute walk test results    Aerobic Endurance Distance Walked 1311   Endurance additional comments ; gait speed 1.2 m/s    [employer]   Sit to  Stand Able to stand without using hands and stabilize independently   Standing Unsupported Able to stand safely 2 minutes   Sitting with Back Unsupported but Feet Supported on Floor or Stool Able to sit safely and securely 2 minutes   Stand to Sit Sits safely with minimal use of hands   Transfers Able to transfer safely, minor use of hands   Standing Unsupported with Eyes Closed Able to stand 10 seconds safely   Standing Ubsupported with Feet Together Able to place feet together independently and stand 1 minute safely   From Standing, Reach Forward with Outstretched Arm Can reach confidently >25 cm (10")   From Standing Position, Pick up Object from Floor Able to pick up shoe safely and easily   From Standing Position, Turn to Look Behind Over each Shoulder Looks behind from both sides and weight shifts well   Turn 360 Degrees  Able to turn 360 degrees safely in 4 seconds or less   Standing Unsupported, Alternately Place Feet on Step/Stool Able to stand independently and safely and complete 8 steps in 20 seconds   Standing Unsupported, One Foot in Front Able to plae foot ahead of the other independently and hold 30 seconds   Standing on One Leg Able to lift leg independently and hold 5-10 seconds   Total Score 54                           PT Education - 03/30/15 1351    Education provided Yes   Education Details prognosis, plan of care, HEP    Person(s) Educated Patient   Methods Explanation;Demonstration;Handout   Comprehension Verbalized understanding;Returned demonstration          PT Short Term Goals - 03/30/15 1402    PT SHORT TERM GOAL #1   Title Patient to be independent in performing appropriate safe HEP   Time 1   Period Days   Status New           PT Long Term Goals - 03/30/15 1403    PT LONG TERM GOAL #1   Title Not indicated as patient not in need of skilled PT services at this time                Plan - 03/30/15 1352    Clinical Impression Statement Patient arrives after experiencing CVA that, per medical notes in EPIC, took place in November; patient does have dementia and was unable to provided accurate or consistent information regarding past function and current complaints. Performed functional testing including 6 minute walk test, BERG, and functional strenght testing. At thist time patient displays some functional weakness and power deficits, however is at a fairly high level of ffunction and not in need of skilled PT services at this time. Gave patient a handout for muscle strengthening, however did not include balance exercises in HEP  due to concern of fall seconday to dementia relatied confusion. No skilled PT services indicated at this time.    Pt will benefit from skilled therapeutic intervention in order to improve on the following deficits Abnormal  gait;Decreased strength;Decreased coordination;Decreased safety awareness   Rehab Potential Excellent   Clinical Impairments Affecting Rehab Potential dementia    PT Frequency One time visit   PT Treatment/Interventions ADLs/Self Care Home Management;Therapeutic exercise   PT Next Visit Plan one time visit only    PT Home Exercise Plan given for strength- did not give for balance due to  concern for fall with task performance impairments secondary to dementia    Consulted and Agree with Plan of Care Patient          G-Codes - 04-01-2015 1404    Functional Assessment Tool Used Based on skilled PT assessment of strength, balance, functional mobility, gait, functional activity tolerance    Functional Limitation Mobility: Walking and moving around   Mobility: Walking and Moving Around Current Status [phone]) At least 1 percent but less than 20 percent impaired, limited or restricted   Mobility: Walking and Moving Around Goal Status [phone]) At least 1 percent but less than 20 percent impaired, limited or restricted   Mobility: Walking and Moving Around Discharge Status [phone]) At least 1 percent but less than 20 percent impaired, limited or restricted       Problem List Patient Active Problem List   Diagnosis Date Noted  . Dementia 03/15/2015  . Thrombocytopenia (HCC) 03/15/2015  . Hypokalemia 03/14/2015  . Seizures (HCC)   . Cerebral amyloid angiopathy   . HLD (hyperlipidemia)   . Essential hypertension   . 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 Proximal humerus fracture 02/28/2014    Nedra Hai PT, DPT [phone]  [facility] [address] [place], [place], [address] Phone: [phone]   Fax:  [phone]  Name: Shawna Banks MRN: [id number] Date of Birth: Aug 25, 1945

## 2015-04-03 ENCOUNTER — Encounter: Payer: Self-pay | Admitting: *Deleted

## 2015-04-03 ENCOUNTER — Ambulatory Visit (HOSPITAL_COMMUNITY): Payer: Medicare Other | Admitting: Speech Pathology

## 2015-04-03 ENCOUNTER — Other Ambulatory Visit: Payer: Self-pay | Admitting: *Deleted

## 2015-04-03 NOTE — Patient Outreach (Signed)
[facility] Care Management  04/03/2015  Shawna Banks 1945-07-30 [id number]  [name]-Stroke referral: Red on dashboard for (went to follow up appointment-no)  Subjective  Telephone call to patient who was able to give HIPPA verification. Patient was unable to state if she had seen primary care physician and had no memory of why she was recently in hospital. Patient states she does have a memory problem. States her daughter was at work but gave me permission to speak with daughter regarding her health concerns. States daughter' s name is Shawna Banks.    RN case manager called patient's daughter Shawna Banks(Belinda [phone]) who verified that patient has seen primary care provider twice since her recent discharge from hospital with diagnosis of stroke. States patient had trouble speaking and couldn't voice the words that she needed to finish a sentence.  States she is doing much better now and outpatient speech therapy was started immediately after she discharged from hospital. States she will also have outpatient physical therapy and occupational therapy but appointments have not been set up yet. States she had initial evaluation for services however. Daughter states patient had short term memory deficit prior to admission for stroke.  States she and her sister take turns staying with her at night. States they prepare her medication and make sure she is taking properly as MD prescribed.  Having no problems with obtaining medications. States she does not have medication list with her currently and not prepared to go medication list now.    States patient has no problems with transportation to doctors' appointments or outpatient therapy sessions. States she or her sister provides transportation. States patient is independent with personal care and has no difficulty walking. Does not use cane or walker. State she and sister prepare  meals for her.   Caregiver states information on signs and  symptoms of stroke would be helpful. States she plans to call neurology office to set up appointment for follow up for 2 months post discharge.  Objective:  See completed assessments as noted.  Unable to complete medication review due caregiver does not have list with her.   Assessment:  Caregiver/daughter will be learner/contact person due patient has memory loss. Caregiver has knowledge deficit regarding stroke signs and symptoms. Patient consents to receiving written and verbal educational information on stroke.  Plan: Will send educational information of stroke signs and symptoms.  Will give verbal explanations regarding stroke. Will follow up will caregiver and follow care plan as noted. Caregiver agrees to set appointment as noted.    Shawna CanLinda Lamyah Creed, RN BSN CCM Care Management Coordinator [facility] Care [facility]  [phone]

## 2015-04-04 ENCOUNTER — Telehealth (HOSPITAL_COMMUNITY): Payer: Self-pay | Admitting: Speech Pathology

## 2015-04-04 ENCOUNTER — Encounter: Payer: Self-pay | Admitting: *Deleted

## 2015-04-04 ENCOUNTER — Encounter (HOSPITAL_COMMUNITY): Payer: Medicare Other | Admitting: Speech Pathology

## 2015-04-04 NOTE — Telephone Encounter (Signed)
Speech Pathology  Telephone call made to Pt and then daughter regarding missed appointment yesterday and to remind them about today's appointment. Pt's daughter, Massie BougieBelinda, stated that the next appointments on her copy of the schedule listed 04/13/15 as the next appointment. She apologized for missing yesterday's appointment and stated they would not be able to make it here today. They plan to be here for her appointment next week on 04/11/15.  Thank you,  Havery MorosDabney Porter, CCC-SLP [phone]

## 2015-04-11 ENCOUNTER — Ambulatory Visit (HOSPITAL_COMMUNITY): Payer: Medicare Other | Admitting: Speech Pathology

## 2015-04-11 DIAGNOSIS — I639 Cerebral infarction, unspecified: Secondary | ICD-10-CM | POA: Diagnosis not present

## 2015-04-11 DIAGNOSIS — IMO0002 Reserved for concepts with insufficient information to code with codable children: Secondary | ICD-10-CM

## 2015-04-11 DIAGNOSIS — R413 Other amnesia: Secondary | ICD-10-CM

## 2015-04-11 NOTE — Therapy (Signed)
[facility] [address] [place], [place], [address] Phone: [phone]   Fax:  [phone]  Speech Language Pathology Treatment  Patient Details  Name: Shawna Banks MRN: [id number] Date of Birth: 09/12/1945 Referring Provider: Pearlean Brownie  Encounter Date: 04/11/2015      End of Session - 04/11/15 2122    Visit Number 3   Number of Visits 16   Date for SLP Re-Evaluation 05/18/15   Authorization Type UHC Medicare   SLP Start Time 1350   SLP Stop Time  1430   SLP Time Calculation (min) 40 min   Activity Tolerance Patient tolerated treatment well      Past Medical History  Diagnosis Date  . Hypertension   . Hypercholesteremia   . Dementia   . Stroke [facility])     No past surgical history on file.  There were no vitals filed for this visit.  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 No/denies               ADULT SLP TREATMENT - 04/11/15 1717    General Information   Behavior/Cognition Alert;Cooperative;Pleasant mood   Patient Positioning Upright in chair   Oral care provided N/A   HPI HPI: pt presents with L Temporoparietal ICH, which Neurology notes as two separate times of bleeding. pt intubated 11/20-11/21 and abnormal EEG results. pt with hx of HTN and Dementia.    Treatment Provided   Treatment provided Cognitive-Linquistic   Pain Assessment   Pain Assessment No/denies pain   Cognitive-Linquistic Treatment   Treatment focused on Aphasia;Patient/family/caregiver education   Skilled Treatment confrontation naming, single word sentence completion, generational naming, word finding strategies   Assessment / Recommendations / Plan   Plan Continue with current plan of care   Progression Toward Goals   Progression toward goals Progressing toward goals             SLP Short Term Goals - 04/11/15 2126    SLP SHORT TERM GOAL #1   Title Pt will increase confrontation naming for high frequency objects to 75% with mod cues from SLP   Baseline 50% mod cues   Time 4   Period Weeks   Status On-going   SLP SHORT TERM GOAL #2   Title Pt will be able to provide at least two descriptive characteristics of common word/proper names with 80% acc when given min cues.   Baseline 70% mod cues   Time 4   Period Weeks   Status On-going   SLP SHORT TERM GOAL #3   Title Pt will increase generational naming for concrete items to 3 items per category when given moderate cues.   Baseline 2 items max cues   Time 4   Period Weeks   Status On-going   SLP SHORT TERM GOAL #4   Title Pt will increase reading comprehension for short paragraphs to 80% acc via multiple choice format when given min cues.   Baseline 65%   Time 4   Period Weeks   Status On-going          SLP Long Term Goals - 04/11/15 2127    SLP LONG TERM GOAL #1   Title Pt will be able to express moderately complex thoughts/feelings to WNL with use of compensatory strategies for  total communication.   Baseline moderate/max impairment   Time 2   Period Months   Status On-going          Plan - 04/11/15 2123    Clinical Impression Statement Pt arrived a few minutes late for treatment and did not have her folder given to her last session with her today. In fact, she denies ever being given one and reports feeling uncertain as to whether she has been to this facility before. She does state that I do look familiar to her. Pt states that she is satisfied with her communication abilities, but that her daughters want her to come. SLP attempted to generate a list of important names (family members, friends, restaurants, etc) with pt. She was able to name five family members with mild/mod cues. Word finding strategies were reviewed, however carryover is questionable given memory deficits. SLP  spoke with friend who drove pt here today and requested that daughter try to come next session.   Speech Therapy Frequency 2x / week   Duration --  8 weeks   Treatment/Interventions SLP instruction and feedback;Cueing hierarchy;Compensatory strategies;Patient/family education;Internal/external aids;Functional tasks;Multimodal communcation approach;Compensatory techniques   Potential to Achieve Goals Good   Potential Considerations Ability to learn/carryover information   SLP Home Exercise Plan Pt will complete HEP as assigned to facilitate carryover of treatment strategies and techniques in home environment   Consulted and Agree with Plan of Care Patient;Family member/caregiver   Family Member Consulted daughter        Problem List Patient Active Problem List   Diagnosis Date Noted  . Dementia 03/15/2015  . Thrombocytopenia (HCC) 03/15/2015  . Hypokalemia 03/14/2015  . Seizures (HCC)   . Cerebral amyloid angiopathy   . HLD (hyperlipidemia)   . Essential hypertension   . 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 Proximal humerus fracture 02/28/2014   Thank you,  Havery MorosDabney Mounir Skipper, CCC-SLP [phone]  [facility]Purvis Sidle 04/11/2015, 9:28 PM  [facility] [address] [place], [place], [address] Phone: [phone]   Fax:  [phone]   Name: Shawna Banks MRN: [id number] Date of Birth: 03/08/1946

## 2015-04-13 ENCOUNTER — Other Ambulatory Visit: Payer: Self-pay | Admitting: *Deleted

## 2015-04-13 ENCOUNTER — Ambulatory Visit (HOSPITAL_COMMUNITY): Payer: Medicare Other | Admitting: Speech Pathology

## 2015-04-13 DIAGNOSIS — I639 Cerebral infarction, unspecified: Secondary | ICD-10-CM | POA: Diagnosis not present

## 2015-04-13 DIAGNOSIS — IMO0002 Reserved for concepts with insufficient information to code with codable children: Secondary | ICD-10-CM

## 2015-04-13 DIAGNOSIS — R413 Other amnesia: Secondary | ICD-10-CM

## 2015-04-13 NOTE — Therapy (Signed)
[facility] [address] [place], [place], [address] Phone: [phone]   Fax:  [phone]  Speech Language Pathology Treatment  Patient Details  Name: Shawna FlockBarbara C Escher MRN: [id number] Date of Birth: 1946-03-06 Referring Provider: Pearlean BrownieSethi  Encounter Date: 04/13/2015      End of Session - 04/13/15 1936    Visit Number 4   Number of Visits 16   Date for SLP Re-Evaluation 05/18/15   Authorization Type [employer] Medicare   SLP Start Time 1515   SLP Stop Time  1600   SLP Time Calculation (min) 45 min   Activity Tolerance Patient tolerated treatment well      Past Medical History  Diagnosis Date  . Hypertension   . Hypercholesteremia   . Dementia   . Stroke [facility](HCC)     No past surgical history on file.  There were no vitals filed for this visit.  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 No/denies               ADULT SLP TREATMENT - 04/13/15 1934    General Information   Behavior/Cognition Alert;Cooperative;Pleasant mood   Patient Positioning Upright in chair   Oral care provided N/A   HPI HPI: pt presents with L Temporoparietal ICH, which Neurology notes as two separate times of bleeding. pt intubated 11/20-11/21 and abnormal EEG results. pt with hx of HTN and Dementia.    Treatment Provided   Treatment provided Cognitive-Linquistic   Pain Assessment   Pain Assessment No/denies pain   Cognitive-Linquistic Treatment   Treatment focused on Aphasia;Patient/family/caregiver education   Skilled Treatment confrontation naming, single word sentence completion, generational naming, word finding strategies, memory strategies   Assessment / Recommendations / Plan   Plan Continue with current plan of care   Progression Toward Goals   Progression  toward goals Progressing toward goals          SLP Education - 04/13/15 1935    Education provided Yes   Education Details Reviewed current recommendations with daughter and plan to create memory book with pt to assist with word recall and daily activities   Person(s) Educated Patient;Child(ren)   Methods Explanation;Demonstration   Comprehension Verbalized understanding;Need further instruction          SLP Short Term Goals - 04/13/15 1943    SLP SHORT TERM GOAL #1   Title Pt will increase confrontation naming for high frequency objects to 75% with mod cues from SLP   Baseline 50% mod cues   Time 4   Period Weeks   Status On-going   SLP SHORT TERM GOAL #2   Title Pt will be able to provide at least two descriptive characteristics of common word/proper names with 80% acc when given min cues.   Baseline 70% mod cues   Time 4   Period Weeks   Status On-going   SLP SHORT TERM GOAL #3   Title Pt will increase generational naming for concrete items to 3 items per category when given moderate cues.   Baseline 2 items max cues   Time 4   Period Weeks   Status On-going   SLP SHORT TERM GOAL #4   Title Pt will increase reading comprehension for short paragraphs to 80% acc via multiple choice  format when given min cues.   Baseline 65%   Time 4   Period Weeks   Status On-going          SLP Long Term Goals - 04/13/15 1943    SLP LONG TERM GOAL #1   Title Pt will be able to express moderately complex thoughts/feelings to WNL with use of compensatory strategies for total communication.   Baseline moderate/max impairment   Time 2   Period Months   Status On-going          Plan - 04/13/15 1937    Clinical Impression Statement Pt was accompanied to SLP therapy session by her daughter, Massie Bougie. SLP reviewed previous session's events and discussed prognosis for ongoing speech therapy. Pt with documented memory deficits and this negatively impacts carrover of SLP strategies  and recommendations. As stated previously, pt did not recall having been here for therapy before and cannot find folder previously given. Pt's verbal expression abilities have improved to mild mild/mod impairment and she feels that she is able to express herself satisfactorily. Pt reminds SLP that she lives alone and doesn't have to talk to anyone. When she does go out (she has never driven), she is always with a close friend or family member. SLP recommended a few more SLP sessions to help create a memory/word finding binder to assist her in being as independent as possible for as long as possible in her own home. Pt already appears to have a good routine in place and SLP recommended providing written cues in the binder along with a calendar and list of names (family, friends, medical professionals, restaurants, business she pays bills to, etc). Daughter is in agreement with plan and SLP will begin next session.    Speech Therapy Frequency 2x / week   Duration 4 weeks   Treatment/Interventions SLP instruction and feedback;Cueing hierarchy;Compensatory strategies;Patient/family education;Internal/external aids;Functional tasks;Multimodal communcation approach;Compensatory techniques   Potential to Achieve Goals Good   Potential Considerations Ability to learn/carryover information   SLP Home Exercise Plan Pt will complete HEP as assigned to facilitate carryover of treatment strategies and techniques in home environment   Consulted and Agree with Plan of Care Patient;Family member/caregiver   Family Member Consulted daughter        Problem List Patient Active Problem List   Diagnosis Date Noted  . Dementia 03/15/2015  . Thrombocytopenia (HCC) 03/15/2015  . Hypokalemia 03/14/2015  . Seizures (HCC)   . Cerebral amyloid angiopathy   . HLD (hyperlipidemia)   . Essential hypertension   . 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 Proximal humerus  fracture 02/28/2014   Thank you,  Havery Moros, CCC-SLP [phone]  [facility] 04/13/2015, 7:45 PM  [place] [facility] [address] [place], [place], [address] Phone: [phone]   Fax:  [phone]   Name: VIVIAN NEUWIRTH MRN: [id number] Date of Birth: [birth year]

## 2015-04-13 NOTE — Patient Outreach (Signed)
[facility] Care Management  04/13/2015  Dustin FlockBarbara C Mullan 10-31-1945 [id number]   [name]-Stroke follow up call:  Telephone call to patient who voices that she is feeling well. States daughter who is caregiver is currently not at her home but advised me to call her at her home. [facility] is caregiver/learner; left message on voice mail requesting return call.   Plan: Will follow up. Colleen CanLinda Omayra Tulloch, RN BSN CCM Care Management Coordinator [facility]  [phone]

## 2015-04-19 ENCOUNTER — Other Ambulatory Visit: Payer: Self-pay | Admitting: *Deleted

## 2015-04-19 ENCOUNTER — Encounter: Payer: Self-pay | Admitting: *Deleted

## 2015-04-19 NOTE — Patient Outreach (Signed)
[facility]) Care Management  04/19/2015  Shawna FlockBarbara C Banks 04/07/1946 [id number]  [name]-stroke follow up call:  Subjective:  Telephone call to daughter/learner who advised that patient has not had any hospital admissions or emergency room visits since last call. States patient has not had any new stroke symptoms since last hospital admission for stroke. Currently patient goes to outpatient speech therapy sessions. States occupational and physical therapy sessions have ended. States patient has mild dementia with memory deficit and is almost back to baseline prior to stroke.   Objective: See completed assessments. See med review as noted.  Assessment: Patient's daughter is Advice workerlearner. Received Transport plannereducational materials in mail. Learner is able to identify stroke symptoms. Learner is able to state action plan if symptoms occur.  Plan: Care plan updated as noted. Follow up with daughter/caregiver/learner in one week. Caregiver agrees with set appointment. Shawna CanLinda Lanise Mergen, RN BSN CCM Care Management Coordinator [facility] Care [facility]  [phone]

## 2015-04-20 ENCOUNTER — Ambulatory Visit (HOSPITAL_COMMUNITY): Payer: Medicare Other | Admitting: Speech Pathology

## 2015-04-20 DIAGNOSIS — R413 Other amnesia: Secondary | ICD-10-CM

## 2015-04-20 DIAGNOSIS — I639 Cerebral infarction, unspecified: Secondary | ICD-10-CM | POA: Diagnosis not present

## 2015-04-20 DIAGNOSIS — IMO0002 Reserved for concepts with insufficient information to code with codable children: Secondary | ICD-10-CM

## 2015-04-21 NOTE — Therapy (Signed)
[place] [facility] [address] [place], [place], [address] Phone: [phone]   Fax:  [phone]  Speech Language Pathology Treatment  Patient Details  Name: Shawna Banks MRN: [id number] Date of Birth: January 14, 1946 Referring Provider: Pearlean BrownieSethi  Encounter Date: 04/20/2015      End of Session - 04/20/15 1706    Visit Number 5   Number of Visits 16   Date for SLP Re-Evaluation 05/18/15   Authorization Type UHC Medicare   SLP Start Time 1600   SLP Stop Time  1650   SLP Time Calculation (min) 50 min   Activity Tolerance Patient tolerated treatment well      Past Medical History  Diagnosis Date  . Hypertension   . Hypercholesteremia   . Dementia   . Stroke [facility](HCC)     No past surgical history on file.  There were no vitals filed for this visit.  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 No/denies               ADULT SLP TREATMENT - 04/20/15 1705    General Information   Behavior/Cognition Alert;Cooperative;Pleasant mood   Patient Positioning Upright in chair   Oral care provided N/A   HPI HPI: pt presents with L Temporoparietal ICH, which Neurology notes as two separate times of bleeding. pt intubated 11/20-11/21 and abnormal EEG results. pt with hx of HTN and Dementia.    Treatment Provided   Treatment provided Cognitive-Linquistic   Pain Assessment   Pain Assessment No/denies pain   Cognitive-Linquistic Treatment   Treatment focused on Aphasia;Patient/family/caregiver education   Skilled Treatment confrontation naming, single word sentence completion, generational naming, word finding strategies, memory strategies   Assessment / Recommendations / Plan   Plan Continue with current plan of care   Progression Toward Goals   Progression  toward goals Progressing toward goals            SLP Short Term Goals - 04/20/15 1708    SLP SHORT TERM GOAL #1   Title Pt will increase confrontation naming for high frequency objects to 75% with mod cues from SLP   Baseline 50% mod cues   Time 4   Period Weeks   Status On-going   SLP SHORT TERM GOAL #2   Title Pt will be able to provide at least two descriptive characteristics of common word/proper names with 80% acc when given min cues.   Baseline 70% mod cues   Time 4   Period Weeks   Status On-going   SLP SHORT TERM GOAL #3   Title Pt will increase generational naming for concrete items to 3 items per category when given moderate cues.   Baseline 2 items max cues   Time 4   Period Weeks   Status On-going   SLP SHORT TERM GOAL #4   Title Pt will increase reading comprehension for short paragraphs to 80% acc via multiple choice format when given min cues.   Baseline 65%   Time 4   Period Weeks   Status On-going          SLP Long Term Goals - 04/20/15 1709    SLP LONG TERM GOAL #1   Title Pt will be able to express moderately complex thoughts/feelings to  WNL with use of compensatory strategies for total communication.   Baseline moderate/max impairment   Time 2   Period Months   Status On-going          Plan - 04/20/15 1708    Clinical Impression Statement Pt was accompanied to SLP therapy session by her daughter, Massie Bougie. SLP facilitated session by encouraging Pt to select several restaurants she enjoys near her home. She deferred to her daughter on every occasion and SLP encouraged Pt to visualize foods she would order and to provide any descriptives that came to mind. SLP also demonstrated ways for her daughter to help cue her without supplying the exact words/names. Pt was able to report that she likes to get hotdogs and began to describe the toppings when the name of a restaurant came to her. Throughout the session, Pt was able to come up with names of  restaurants in [place] and [place] with mod/max cues from SLP and daughter. Once written on the list, Pt independently referred back to list x 2. SLP will begin to build memory book to use with pt next session. Daughter in agreement with plan of care. Pt is in agreement as well, but states that she doesn't want to be a bother to her daughters.   Speech Therapy Frequency 2x / week   Duration 4 weeks   Treatment/Interventions SLP instruction and feedback;Cueing hierarchy;Compensatory strategies;Patient/family education;Internal/external aids;Functional tasks;Multimodal communcation approach;Compensatory techniques   Potential to Achieve Goals Good   Potential Considerations Ability to learn/carryover information   SLP Home Exercise Plan Pt will complete HEP as assigned to facilitate carryover of treatment strategies and techniques in home environment   Consulted and Agree with Plan of Care Patient;Family member/caregiver   Family Member Consulted daughter        Problem List Patient Active Problem List   Diagnosis Date Noted  . Dementia 03/15/2015  . Thrombocytopenia (HCC) 03/15/2015  . Hypokalemia 03/14/2015  . Seizures (HCC)   . Cerebral amyloid angiopathy   . HLD (hyperlipidemia)   . Essential hypertension   . 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 Proximal humerus fracture 02/28/2014    Shawna Banks 04/20/2015, 5:11 PM  [place] [facility] [address] [place], [place], [address] Phone: [phone]   Fax:  [phone]   Name: Shawna Banks MRN: [id number] Date of Birth: 11-Dec-1945

## 2015-04-25 ENCOUNTER — Ambulatory Visit (HOSPITAL_COMMUNITY): Payer: Medicare Other | Attending: Nurse Practitioner | Admitting: Speech Pathology

## 2015-04-25 DIAGNOSIS — R413 Other amnesia: Secondary | ICD-10-CM | POA: Diagnosis present

## 2015-04-25 DIAGNOSIS — I69320 Aphasia following cerebral infarction: Secondary | ICD-10-CM | POA: Insufficient documentation

## 2015-04-25 DIAGNOSIS — IMO0002 Reserved for concepts with insufficient information to code with codable children: Secondary | ICD-10-CM

## 2015-04-25 NOTE — Therapy (Signed)
[facility] [address] [place], [place], [address] Phone: [phone]   Fax:  [phone]  Speech Language Pathology Treatment  Patient Details  Name: Shawna FlockBarbara C Banks MRN: [id number] Date of Birth: May 16, 1945 Referring Provider: Pearlean BrownieSethi  Encounter Date: 04/25/2015      End of Session - 04/25/15 1639    Visit Number 6   Number of Visits 16   Date for SLP Re-Evaluation 05/18/15   Authorization Type UHC Medicare   SLP Start Time 1345   SLP Stop Time  1430   SLP Time Calculation (min) 45 min   Activity Tolerance Patient tolerated treatment well      Past Medical History  Diagnosis Date  . Hypertension   . Hypercholesteremia   . Dementia   . Stroke [facility](HCC)     No past surgical history on file.  There were no vitals filed for this visit.  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 No/denies               ADULT SLP TREATMENT - 04/25/15 0001    General Information   Behavior/Cognition Alert;Cooperative;Pleasant mood   Patient Positioning Upright in chair   Oral care provided N/A   HPI HPI: pt presents with L Temporoparietal ICH, which Neurology notes as two separate times of bleeding. pt intubated 11/20-11/21 and abnormal EEG results. pt with hx of HTN and Dementia.    Treatment Provided   Treatment provided Cognitive-Linquistic   Pain Assessment   Pain Assessment No/denies pain   Cognitive-Linquistic Treatment   Treatment focused on Cognition;Aphasia;Patient/family/caregiver education   Assessment / Recommendations / Plan   Plan Continue with current plan of care   Progression Toward Goals   Progression toward goals Progressing toward goals            SLP Short Term Goals - 04/25/15 1649    SLP SHORT TERM GOAL #1   Title Pt will increase  confrontation naming for high frequency objects to 75% with mod cues from SLP   Baseline 50% mod cues   Time 4   Period Weeks   Status On-going   SLP SHORT TERM GOAL #2   Title Pt will be able to provide at least two descriptive characteristics of common word/proper names with 80% acc when given min cues.   Baseline 70% mod cues   Time 4   Period Weeks   Status On-going   SLP SHORT TERM GOAL #3   Title Pt will increase generational naming for concrete items to 3 items per category when given moderate cues.   Baseline 2 items max cues   Time 4   Period Weeks   Status On-going   SLP SHORT TERM GOAL #4   Title Pt will increase reading comprehension for short paragraphs to 80% acc via multiple choice format when given min cues.   Baseline 65%   Time 4   Period Weeks   Status On-going          SLP Long Term Goals - 04/25/15 1649    SLP LONG TERM GOAL #1   Title Pt will be able to express moderately complex thoughts/feelings to WNL with use of compensatory strategies for total communication.   Baseline moderate/max impairment   Time 2  Period Months   Status On-going          Plan - 04/25/15 1639    Clinical Impression Statement Ms. Wirick again questioned need for coming to therapy. SLP explained that we were working to create a word finding/memory book for her to use to increase her independency with communication at home and with family/friends. Pt was able to name her siblings with mild/moderate prompts. She immediately defers to, "Oh now I don't know all that... I'm an old woman!" when asked for names of family members, however given some time and cues for word finding she is able to come up with several names. SLP recorded information previously gathered with assist from her daughter in the computer to print out for patient's memory book. This was reviewed with patient and she was asked to review with family (sticky note reminder to top of page) and bring back with any  changes for tomorrow's visit. Pt with decreased appreciation of short term memory and word finding deficits, however willing to "go along" with SLP in session. Plan to see pt for 1-2 more visits to complete notebook and pt/caregiver education.   Speech Therapy Frequency 2x / week   Duration 2 weeks   Treatment/Interventions SLP instruction and feedback;Cueing hierarchy;Compensatory strategies;Patient/family education;Internal/external aids;Functional tasks;Multimodal communcation approach;Compensatory techniques   Potential to Achieve Goals Good   Potential Considerations Ability to learn/carryover information   SLP Home Exercise Plan Pt will complete HEP as assigned to facilitate carryover of treatment strategies and techniques in home environment   Consulted and Agree with Plan of Care Patient;Family member/caregiver   Family Member Consulted daughter        Problem List Patient Active Problem List   Diagnosis Date Noted  . Dementia 03/15/2015  . Thrombocytopenia (HCC) 03/15/2015  . Hypokalemia 03/14/2015  . Seizures (HCC)   . Cerebral amyloid angiopathy   . HLD (hyperlipidemia)   . Essential hypertension   . 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 Proximal humerus fracture 02/28/2014   Thank you,  Havery Moros, CCC-SLP [phone]  [facility] 04/25/2015, 4:50 PM  [place] [facility] [address] [place], [place], [address] Phone: [phone]   Fax:  [phone]   Name: Shawna Banks MRN: [id number] Date of Birth: 25-Jul-1945

## 2015-04-26 ENCOUNTER — Ambulatory Visit (HOSPITAL_COMMUNITY): Payer: Medicare Other | Admitting: Speech Pathology

## 2015-04-26 DIAGNOSIS — IMO0002 Reserved for concepts with insufficient information to code with codable children: Secondary | ICD-10-CM

## 2015-04-26 DIAGNOSIS — I69320 Aphasia following cerebral infarction: Secondary | ICD-10-CM | POA: Diagnosis not present

## 2015-04-26 DIAGNOSIS — R413 Other amnesia: Secondary | ICD-10-CM

## 2015-04-26 NOTE — Therapy (Signed)
Shawna Banks [address] [place], [place], [address] Phone: [phone]   Fax:  [phone]  Speech Language Pathology Treatment  Patient Details  Name: Shawna Banks MRN: [id number] Date of Birth: 1945-05-19 Referring Provider: Pearlean Banks  Encounter Date: 04/26/2015      End of Session - 04/26/15 1351    Visit Number 7   Number of Visits 16   Date for SLP Re-Evaluation 05/18/15   Authorization Type U[employer] Medicare   SLP Start Time 1100   SLP Stop Time  1145   SLP Time Calculation (min) 45 min   Activity Tolerance Patient tolerated treatment well      Past Medical History  Diagnosis Date  . Hypertension   . Hypercholesteremia   . Dementia   . Stroke [facility])     No past surgical history on file.  There were no vitals filed for this visit.  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 No/denies               ADULT SLP TREATMENT - 04/26/15 1350    General Information   Behavior/Cognition Alert;Cooperative;Pleasant mood   Patient Positioning Upright in chair   Oral care provided N/A   HPI HPI: pt presents with L Temporoparietal ICH, which Neurology notes as two separate times of bleeding. pt intubated 11/20-11/21 and abnormal EEG results. pt with hx of HTN and Dementia.    Treatment Provided   Treatment provided Cognitive-Linquistic   Pain Assessment   Pain Assessment No/denies pain   Cognitive-Linquistic Treatment   Treatment focused on Cognition;Aphasia;Patient/family/caregiver education   Skilled Treatment confrontation naming, single word sentence completion, generational naming, word finding strategies, memory strategies   Assessment / Recommendations / Plan   Plan Continue  with current plan of care   Progression Toward Goals   Progression toward goals Progressing toward goals            SLP Short Term Goals - 04/26/15 1353    SLP SHORT TERM GOAL #1   Title Pt will increase confrontation naming for high frequency objects to 75% with mod cues from SLP   Baseline 50% mod cues   Time 4   Period Weeks   Status On-going   SLP SHORT TERM GOAL #2   Title Pt will be able to provide at least two descriptive characteristics of common word/proper names with 80% acc when given min cues.   Baseline 70% mod cues   Time 4   Period Weeks   Status On-going   SLP SHORT TERM GOAL #3   Title Pt will increase generational naming for concrete items to 3 items per category when given moderate cues.   Baseline 2 items max cues   Time 4   Period Weeks   Status On-going   SLP SHORT TERM GOAL #4   Title Pt will increase reading comprehension for short paragraphs to 80% acc via multiple choice format when given min cues.   Baseline 65%   Time 4   Period Weeks   Status On-going          SLP Long Term Goals - 04/26/15 1353    SLP LONG TERM GOAL #1   Title  Pt will be able to express moderately complex thoughts/feelings to WNL with use of compensatory strategies for total communication.   Baseline moderate/max impairment   Time 2   Period Months   Status On-going          Plan - 04/26/15 1351    Clinical Impression Statement Ms. Shawna Banks brought the paper given to her yesterday by me, but stated she didn't know where it came from or how it got there. Her daughter added some information on it so it was added for pt. Rationale for written "memory reminders" was reviewed with patient and she nodded in agreement. Ms. Shawna Banks was able to navigate written pages (3) with mild/mod cues from SLP. SLP attempted to add list of medications, however pt unable to confirm so family will need to add this. Plan for one more session prior to discharge if family is able to attend. Pt in  agreement with POC.    Speech Therapy Frequency 1x /week   Duration 1 week   Treatment/Interventions SLP instruction and feedback;Cueing hierarchy;Compensatory strategies;Patient/family education;Internal/external aids;Functional tasks;Multimodal communcation approach;Compensatory techniques   Potential to Achieve Goals Good   Potential Considerations Ability to learn/carryover information   SLP Home Exercise Plan Pt will complete HEP as assigned to facilitate carryover of treatment strategies and techniques in home environment   Consulted and Agree with Plan of Care Patient;Family member/caregiver   Family Member Consulted daughter        Problem List Patient Active Problem List   Diagnosis Date Noted  . Dementia 03/15/2015  . Thrombocytopenia (HCC) 03/15/2015  . Hypokalemia 03/14/2015  . Seizures (HCC)   . Cerebral amyloid angiopathy   . HLD (hyperlipidemia)   . Essential hypertension   . 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 Proximal humerus fracture 02/28/2014   Thank you,  Shawna Banks, Shawna Banks [phone]  [facility] Shawna Banks 04/26/2015, 1:54 PM  [facility] [address] [place], [place], [address] Phone: [phone]   Fax:  [phone]   Name: Shawna FlockBarbara C Banks MRN: [id number] Date of Birth: Nov 17, 1945

## 2015-04-27 ENCOUNTER — Other Ambulatory Visit: Payer: Self-pay | Admitting: *Deleted

## 2015-04-27 ENCOUNTER — Encounter: Payer: Self-pay | Admitting: *Deleted

## 2015-04-27 NOTE — Patient Outreach (Signed)
[facility] Care Management  04/27/2015  Shawna Banks 1945/11/18 [id number]   [name]-stroke follow up call.  Subjective: Telephone call to daughter-Belinda who is Advice workerlearner. States patient has not had stroke symptoms; no hospital admissions or emergency room visits since last contact with this RN CM. States has set up appointment with neurologist-Dr. Roda ShuttersXu for follow up. Also states she continues outpatient speech therapy sessions twice weekly.  Medications reviewed with learner who had list available. States patient does not have advanced care directives in place but plans to talk to her about it. States she would like Agricultural engineereducational material on Advance Care planning but does not wish to speak with social worker at this time.   Objective: See medication lis as noted. See assessments as noted.  Assessments: Learner requesting written information on Advance Care Planning. Patient has memory deficit. Learner manages patient medications needs, provides transportation , helps run household concerns.  Plan:  Update care plan as noted. Discuss importance of Advance care planning. Send Agricultural engineereducational material on Advance Care planning. Follow with learner regarding educational materials.  Will send educational materials on Advanced care planning.  Colleen CanLinda Lyrica Mcclarty, RN BSN CCM Care Management Coordinator [facility]  [phone]

## 2015-05-01 ENCOUNTER — Ambulatory Visit (HOSPITAL_COMMUNITY): Payer: Medicare Other | Admitting: Speech Pathology

## 2015-05-03 ENCOUNTER — Ambulatory Visit (HOSPITAL_COMMUNITY): Payer: Medicare Other | Admitting: Speech Pathology

## 2015-05-05 ENCOUNTER — Other Ambulatory Visit: Payer: Self-pay | Admitting: *Deleted

## 2015-05-05 NOTE — Patient Outreach (Signed)
[facility] Care Management  05/05/2015  Shawna Banks 07-22-1945 [id number]   [name] follow up telephone call to caregiver/daughter; no answer; left message on voice mail requesting call back .  Plan> Will follow up. Colleen CanLinda Bhavik Cabiness, RN BSN CCM Care Management Coordinator [facility]  [phone]

## 2015-05-08 ENCOUNTER — Ambulatory Visit (HOSPITAL_COMMUNITY): Payer: Medicare Other | Admitting: Speech Pathology

## 2015-05-08 ENCOUNTER — Other Ambulatory Visit: Payer: Self-pay | Admitting: *Deleted

## 2015-05-08 ENCOUNTER — Encounter: Payer: Self-pay | Admitting: *Deleted

## 2015-05-08 DIAGNOSIS — R413 Other amnesia: Secondary | ICD-10-CM

## 2015-05-08 DIAGNOSIS — I69320 Aphasia following cerebral infarction: Secondary | ICD-10-CM | POA: Diagnosis not present

## 2015-05-08 DIAGNOSIS — IMO0002 Reserved for concepts with insufficient information to code with codable children: Secondary | ICD-10-CM

## 2015-05-08 NOTE — Therapy (Signed)
Shawna Banks [place], [place], [address] Phone: [phone]   Fax:  [phone]  Speech Language Pathology Treatment  Patient Details  Name: Shawna Banks MRN: [id number] Date of Birth: [birth year] Referring Provider: Leonie Man  Encounter Date: 05/08/2015      End of Session - 05/08/15 1903    Visit Number 8   Number of Visits 16   Date for SLP Re-Evaluation 05/18/15   Authorization Type UHC Medicare   SLP Start Time 1345   SLP Stop Time  1430   SLP Time Calculation (min) 45 min   Activity Tolerance Patient tolerated treatment well      Past Medical History  Diagnosis Date  . Hypertension   . Hypercholesteremia   . Dementia   . Stroke [facility])     No past surgical history on file.  There were no vitals filed for this visit.  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 No/denies          ADULT SLP TREATMENT - 05/08/15 1902    General Information   Behavior/Cognition Alert;Cooperative;Pleasant mood   Patient Positioning Upright in chair   Oral care provided N/A   HPI HPI: pt presents with L Temporoparietal ICH, which Neurology notes as two separate times of bleeding. pt intubated 11/20-11/21 and abnormal EEG results. pt with hx of HTN and Dementia.    Treatment Provided   Treatment provided Cognitive-Linquistic   Pain Assessment   Pain Assessment No/denies pain   Cognitive-Linquistic Treatment   Treatment focused on Cognition;Aphasia;Patient/family/caregiver education   Skilled Treatment confrontation naming, single word sentence completion, generational naming, word finding strategies, memory strategies   Assessment / Recommendations / Plan   Plan Discharge SLP treatment due to (comment)   Progression Toward Goals   Progression  toward goals Goals met, education completed, patient discharged from Shawna Banks - 05/08/15 Shawna Banks #1   Title Pt will increase confrontation naming for high frequency objects to 75% with mod cues from SLP   Baseline 50% mod cues   Time 4   Period Weeks   Status Partially Met   SLP SHORT TERM GOAL #2   Title Pt will be able to provide at least two descriptive characteristics of common word/proper names with 80% acc when given min cues.   Baseline 70% mod cues   Time 4   Period Weeks   Status Achieved   SLP SHORT TERM GOAL #3   Title Pt will increase generational naming for concrete items to 3 items per category when given moderate cues.   Baseline 2 items max cues   Time 4   Period Weeks   Status Partially Met   SLP SHORT TERM GOAL #4   Title Pt will increase reading comprehension for short paragraphs to 80% acc via multiple choice format when given min cues.   Baseline 65%   Time 4   Period Weeks   Status Partially Met          SLP Long Term Goals - 05/08/15 1907    SLP LONG TERM GOAL #1   Title Pt will be able to express moderately complex  thoughts/feelings to WNL with use of compensatory strategies for total communication.   Baseline moderate/max impairment   Time 2   Period Months   Status Partially Met          Plan - 2015/05/15 1904    Clinical Impression Statement [name] did not bring her folder with "reminder pages" this date and stated she did not know she was supposed to bring it. She again inquired as she does every session, how long she would be coming here. SLP provided small notebook and printed pages given previously for pt to use as her memory/communication book. Pt has made progress toward expressive language goals, however memory impairment makes it difficult for her to use strategies. SLP created a memory/communication book for her to use after discussing with her daughter. SLP reinforced that family  will be the ones to ensure follow through with use of book at home due to severity of patient's memory deficit. [name] is able to answer personal questions related to family, birthdays, names of doctors, restaurants she enjoys, and typical foods eaten at home with cue to open her notebook and use pages as needed. Pt reports that she is a quiet person and is able to communicate satisfactorily with close friends and family. Every session, she asks how long she has to come here and needs re-introduction to most tasks. Will discharge speech therapy at this time with plan for pt to use her memory/communication book at home. Pt/family in agreement with plan.     Duration 1 week   Treatment/Interventions SLP instruction and feedback;Cueing hierarchy;Compensatory strategies;Patient/family education;Internal/external aids;Functional tasks;Multimodal communcation approach;Compensatory techniques   Potential to Achieve Goals Good   Potential Considerations Ability to learn/carryover information   SLP Home Exercise Plan Pt will complete HEP as assigned to facilitate carryover of treatment strategies and techniques in home environment   Consulted and Agree with Plan of Care Patient;Family member/caregiver   Family Member Consulted daughter          G-Codes - 15-May-2015 07/01/1906    Functional Assessment Tool Used clinical judgment   Functional Limitations Spoken language expressive   Spoken Language Expression Goal Status [phone]) At least 1 percent but less than 20 percent impaired, limited or restricted   Spoken Language Expression Discharge Status [phone]) At least 20 percent but less than 40 percent impaired, limited or restricted      Problem List Patient Active Problem List   Diagnosis Date Noted  . Dementia 03/15/2015  . Thrombocytopenia ([place]) 03/15/2015  . Hypokalemia 03/14/2015  . Seizures ([place])   . Cerebral amyloid angiopathy   . HLD (hyperlipidemia)   . Essential hypertension   . 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 Proximal humerus fracture 02/28/2014   SPEECH THERAPY DISCHARGE SUMMARY  Visits from Start of Care: 8  Current functional level related to goals / functional outcomes: Partially met all goals, progress limited by memory impairment. See above.   Remaining deficits: Mild/mod expressive language deficits negatively impacted by memory impairment with inability to use internal strategies; notebook is effective to help with word finding and should be used   Education / Equipment: Comptroller. Plan: Patient agrees to discharge.  Patient goals were partially met. Patient is being discharged due to being pleased with the current functional level.  ?????       Thank you,  Genene Churn, [place]  [place] 05/15/15, 7:10 PM  [place]  [place], [place], [address] Phone: [phone]   Fax:  [phone]   Name: Shawna Banks MRN: [id number] Date of Birth: Dec 09, 1945

## 2015-05-08 NOTE — Patient Outreach (Signed)
[facility] Care Management  05/08/2015  Dustin FlockBarbara C Kunert [birth year] [id number]  [name]-stroke follow up call x 2; left message with daughter/caregiver to return call.  Plan:  Will follow up. Will send outreach letter.  Colleen CanLinda Leanda Padmore, RN BSN CCM Care Management Coordinator [place] Care Management  [phone]

## 2015-05-10 ENCOUNTER — Encounter (HOSPITAL_COMMUNITY): Payer: Medicare Other | Admitting: Speech Pathology

## 2015-05-10 ENCOUNTER — Other Ambulatory Visit: Payer: Self-pay | Admitting: *Deleted

## 2015-05-10 NOTE — Patient Outreach (Addendum)
[facility]) Care Management  05/10/2015  Shawna Banks 04/03/1946 [id number]   [name]-Stroke follow up.  Received incoming call from patient's daughter who is caregiver.  Daughter is caregiver and learner (pt has memory deficit).  Subjective:  Learner states patient has not had hospital admission or emergency room visit since recent  admission with stroke diagnosis. States patient has had several primary care visits and has follow up appointment with neurologist scheduled 05/25/2015. States patient currently going to outpatient speech therapy and sessions will end next week.  Learner states she or sister are taking patient's blood pressure weekly & recording. States they take record of blood pressure to patient's doctor's appointments. States doctor has advised of target blood pressure that he wants patient to maintain. States they report to MD office if over threshold.   Learner reports that she has received Advanced Directives packet that was sent. States she has discussed with her sister and patient and they plan to proceed with completing paper work.  States she does not need any further assistance and they plan to have completed papers signed by notary.    Objectives: See assessments as noted. See medication review as noted.  Assessment:  No hospital readmissions. No emergency room visits. Neurology appointment has been scheduled. Patient will complete outpatient speech therapy next week. Care Plan goals met.  Plan: Update care plan as noted.  Plan to close out case.  Send to care management assistant to close out.  Sherrin Daisy, RN BSN [place] Management Coordinator [facility] Care Management  [phone]

## 2015-05-12 ENCOUNTER — Ambulatory Visit: Payer: Self-pay | Admitting: *Deleted

## 2015-05-15 ENCOUNTER — Encounter (HOSPITAL_COMMUNITY): Payer: Medicare Other | Admitting: Speech Pathology

## 2015-05-17 ENCOUNTER — Encounter (HOSPITAL_COMMUNITY): Payer: Medicare Other | Admitting: Speech Pathology

## 2015-05-22 ENCOUNTER — Encounter (HOSPITAL_COMMUNITY): Payer: Medicare Other | Admitting: Speech Pathology

## 2015-05-24 ENCOUNTER — Encounter (HOSPITAL_COMMUNITY): Payer: Medicare Other | Admitting: Speech Pathology

## 2015-05-26 ENCOUNTER — Ambulatory Visit (INDEPENDENT_AMBULATORY_CARE_PROVIDER_SITE_OTHER): Payer: Medicare Other | Admitting: Neurology

## 2015-05-26 ENCOUNTER — Encounter: Payer: Self-pay | Admitting: Neurology

## 2015-05-26 VITALS — BP 143/83 | HR 66 | Ht 65.0 in | Wt 123.0 lb

## 2015-05-26 DIAGNOSIS — I69328 Other speech and language deficits following cerebral infarction: Secondary | ICD-10-CM

## 2015-05-26 DIAGNOSIS — I69359 Hemiplegia and hemiparesis following cerebral infarction affecting unspecified side: Secondary | ICD-10-CM

## 2015-05-26 DIAGNOSIS — R569 Unspecified convulsions: Secondary | ICD-10-CM

## 2015-05-26 DIAGNOSIS — E854 Organ-limited amyloidosis: Secondary | ICD-10-CM

## 2015-05-26 DIAGNOSIS — I680 Cerebral amyloid angiopathy: Secondary | ICD-10-CM | POA: Diagnosis not present

## 2015-05-26 DIAGNOSIS — F0390 Unspecified dementia without behavioral disturbance: Secondary | ICD-10-CM | POA: Diagnosis not present

## 2015-05-26 HISTORY — DX: Hemiplegia and hemiparesis following cerebral infarction affecting unspecified side: I69.328

## 2015-05-26 NOTE — Progress Notes (Signed)
Reason for visit: Lobar hemorrhage  Referring physician: [facility]  Shawna Banks is a 70 y.o. female  History of present illness:  Ms. Shawna Banks is a 70 year old left-handed white female with a history of a progressive dementing illness. The patient is followed by Dr. Judithann Sheen for this, she was on Aricept. She was admitted to the hospital on 03/12/2015 with a left parietal intracranial hemorrhage. The patient had MRI brain evaluation that showed evidence of microhemorrhages throughout the brain consistent with amyloid angiopathy. This was felt to be the source of the lobar hemorrhage. The patient had seizures in the hospital, necessitating addition of Keppra to her drug regimen. She has done well in this regard on Keppra, she has not had any recurring seizures. The patient does not operate a motor vehicle. At this point, she has done quite well in her recovery. She has required some speech therapy as an outpatient, she still has a mild residual aphasia. She has no significant issues with vision, numbness or weakness of extremities, or any significant balance issues. The patient lives alone at home, but her family looks in on her frequently. She is able to do her own finances, the family helps her keep up with medications and appointments. The patient has not had any new symptoms since coming out of the hospital. She comes here for follow-up following the November 2016 hospitalization.  Past Medical History  Diagnosis Date  . Hypertension   . Hypercholesteremia   . Dementia   . Stroke (HCC)   . 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 No pertinent past surgical history.  Family History  Problem Relation Age of Onset  . Hypertension Mother   . Heart attack Mother   . Lung cancer Sister   . Hypertension Father   . Cancer Sister     Social history:  reports that she has never smoked. She has never used smokeless  tobacco. She reports that she does not drink alcohol or use illicit drugs.  Medications:  Prior to Admission medications   Medication Sig Start Date End Date Taking? Authorizing Provider  donepezil (ARICEPT) 10 MG tablet Take 10 mg by mouth at bedtime.   Yes Historical Provider, MD  enalapril (VASOTEC) 20 MG tablet Take 20 mg by mouth daily.   Yes Historical Provider, MD  levETIRAcetam (KEPPRA) 1000 MG tablet Take 1 tablet (1,000 mg total) by mouth 2 (two) times daily. 03/15/15  Yes Layne Benton, NP  simvastatin (ZOCOR) 40 MG tablet Take 40 mg by mouth at bedtime.    Yes Historical Provider, MD  Vitamin D, Ergocalciferol, (DRISDOL) 50000 UNITS CAPS capsule Take 50,000 Units by mouth every 14 (fourteen) days. Every other Wednesday   Yes Historical Provider, MD     No Known Allergies  ROS:  Out of a complete 14 system review of symptoms, the patient complains only of the following symptoms, and all other reviewed systems are negative.  Memory loss Speech disturbance  Blood pressure 143/83, pulse 66, height  (1.651 m), weight 123 lb (55.792 kg).  Physical Exam  General: The patient is alert and cooperative at the time of the examination.  Eyes: Pupils are equal, round, and reactive to light. Discs are flat bilaterally.  Neck: The neck is supple, no carotid bruits are noted.  Respiratory: The respiratory examination is clear.  Cardiovascular: The cardiovascular examination reveals a regular rate and rhythm, no obvious murmurs or  rubs are noted.  Skin: Extremities are without significant edema.  Neurologic Exam  Mental status: The patient is alert and oriented x 3 at the time of the examination. The patient has apparent normal recent and remote memory, with an apparently normal attention span and concentration ability.  Cranial nerves: Facial symmetry is present. There is good sensation of the face to pinprick and soft touch bilaterally. The strength of the facial muscles  and the muscles to head turning and shoulder shrug are normal bilaterally. Speech is well enunciated, a mild aphasia is present. The patient demonstrates word finding problems and paraphasic errors. Extraocular movements are full. Visual fields are full. The tongue is midline, and the patient has symmetric elevation of the soft palate. No obvious hearing deficits are noted.  Motor: The motor testing reveals 5 over 5 strength of all 4 extremities. Good symmetric motor tone is noted throughout.  Sensory: Sensory testing is intact to pinprick, soft touch, vibration sensation, and position sense on all 4 extremities. No evidence of extinction is noted.  Coordination: Cerebellar testing reveals good finger-nose-finger and heel-to-shin bilaterally.  Gait and station: Gait is normal. Tandem gait is normal. Romberg is negative. No drift is seen.  Reflexes: Deep tendon reflexes are symmetric and normal bilaterally. Toes are downgoing bilaterally.   MRI with and without contrast 03/13/2015 - 4.6 x 2.7 x 3 cm left mid to posterior temporal lobe hematoma with fluid level. Surrounding vasogenic edema. Engorgement of compressed surrounding veins without discrete mass seen separate from the hematoma. Mild mass effect upon the lateral aspect of the left lateral ventricle without midline shift. - Scattered blood breakdown products may reflect result of amyloid angiography which may be the source of the left temporal lobe hematoma. - Recommend followup until clearance to confirm that hematoma clears as expected for simple hematoma. - Global atrophy without hydrocephalus. - Moderate small vessel disease type changes.  * MRI scan images were reviewed online. I agree with the written report.   MRA 03/13/2015 - No abnormal vessels seen surrounding the left temporal lobe hematoma. - Minimal bulge distal M1 segment left middle cerebral artery appears to be origin of vessel without discrete aneurysm noted. -  Minimal to mild intracranial atherosclerotic type changes as discussed above.  TTE -  - Left ventricle: The cavity size was normal. Wall thickness wasnormal. Systolic function was normal. The estimated ejectionfraction was in the range of 55% to 60%. Wall motion was normal;there were no regional wall motion abnormalities. Features areconsistent with a pseudonormal left ventricular filling pattern,with concomitant abnormal relaxation and increased fillingpressure (grade 2 diastolic dysfunction). - Left atrium: The atrium was mildly dilated. - Atrial septum: There was increased thickness of the septum,consistent with lipomatous hypertrophy. Impressions: Technically limited study due to poor sound wavetransmission.  EEG - Impression: This sedated EEG is abnormal due to the presence of: 1. Occasional focal slowing over the left temporal region 2. Rare left temporal epileptiform discharges 3. Except for excess amount of diffuse low voltage beta activity. Clinical Correlation of the above findings indicates focal cerebral dysfunction over the left temporal region suggestive of underlying structural or physiologic abnormality with a possible tendency for seizures to arise from this region. Diffuse low voltage beta activity is commonly seen with sedating medications. There were no electrographic seizures seen in this study.    Assessment/Plan:  1. Progressive dementia  2. Amyloid angiopathy  3. Left parietal lobar hemorrhage  The patient appears to have had a lobar hemorrhage likely secondary to  the amyloid angiopathy. This likely is the source of the progressive dementia as well. The patient is to remain on Aricept at this time, and on the Keppra for now. She may be able to come down off of the Keppra in about one year following the hemorrhage, I would recommend repeating EEG study prior to a slow taper off of Keppra. Fortunately, the patient does not operate a motor vehicle. The patient  should not take antiplatelet agents at any time in the future. The patient was not on aspirin or Plavix at the time of the hemorrhage. The patient will follow-up through this office on an as-needed basis.   Marlan Palau MD 05/26/2015 2:03 PM  [facility] [address] [place], [place] [address]  Phone [phone] Fax [phone]

## 2015-05-26 NOTE — Patient Instructions (Signed)
Intracerebral Hemorrhage  An intracerebral hemorrhage occurs when a blood vessel in the brain leaks or bursts. Areas of the brain that should receive blood, oxygen, and nutrients from the damaged blood vessel are deprived of blood flow. This causes areas of the brain to become damaged. Damage also occurs to areas of the brain where the leaked blood accumulates and presses on normal tissue. This is a medical emergency. This can cause permanent damage and loss of brain function. Early and aggressive treatment leads to better recovery.   CAUSES   · Head injury (trauma).  · A ballooning of a weak section in a blood vessel (aneurysm).  · Hardened, thin blood vessels. Blood vessel walls lined with plaque become thin and hardened. These hardened, thin artery walls can crack open and allow blood to flow out of the blood vessel.  · An abnormal formation of a blood vessel (arteriovenous malformation). This condition results in an abnormal tangle of thin-walled blood vessels.  · Protein buildup on the artery walls of the brain (amyloid angiopathy).   Sometimes the cause of an intracerebral hemorrhage is not known.  RISK FACTORS   · High blood pressure (hypertension).  · Female gender.  · Advancing age.  · Moderate or heavy use of alcohol.  · Taking blood-thinning medicines (anticoagulants).  SIGNS AND SYMPTOMS  · Sudden, severe headache with no known cause. The headache is often described as the worst headache ever experienced.  · Sudden confusion.  · Sudden trouble speaking (aphasia) or understanding.  · Seizures.  · Sudden nausea and vomiting.  · Hypertension.  · Sudden weakness or numbness of the face, arm, or leg, especially on one side of the body.  · Sudden trouble seeing in one or both eyes.  · Sudden trouble walking or difficulty moving arms or legs.  · Sudden dizziness.  · Sudden loss of balance or coordination.  DIAGNOSIS   An intracerebral hemorrhage is diagnosed with:  · A history and physical exam.  · Imaging tests  to view the brain. These may include a CT scan or MRI.  · Imaging tests to view vessels in the brain. These may include computed tomography angiography (CTA) or a magnetic resonance angiogram (MRA).  TREATMENT   An intracerebral hemorrhage requires emergency treatment. The goals of treatment are to stop the bleeding, control pressure in the brain, and relieve symptoms. Treatment may include:    · Medicines to manage:    Blood pressure.    Pain.    Nausea or vomiting.    Seizures.    Fever.  · Other medicines, blood products, or vitamin K may also be given to control the bleeding.  · A tube (shunt) in the brain to relieve pressure.  · Assisted breathing (ventilation).  · Surgery to stop bleeding, remove a blood clot or tumor, and reduce pressure. Surgeries to do this include:    Craniotomy. This is a surgery to remove part of the skull to reduce pressure.    Stereotactic aspiration. During this procedure, a needle or syringe is inserted into the brain to remove blood.  HOME CARE INSTRUCTIONS  · Take medicines only as directed by your health care provider.  · Eat healthy foods as directed by your health care provider:    A diet low in salt (sodium), saturated fat, trans fat, and cholesterol may be recommended to manage your blood pressure.    Foods may need to be soft or pureed, or small bites may need to be taken in order   to avoid aspirating or choking.    If studies show that your ability to swallow safely has been affected, you may need to seek help from specialists such as a dietitian, speech and language pathologist, or an occupational therapist. These health care providers can teach you how to safely get the nutrition your body needs.  · Follow physical activity guidelines as directed by your health care team.  · Avoid heavy alcohol use.  · Manage any other health care conditions you may have, if applicable.  · A safe home environment is important to reduce the risk of falls. Your health care provider may  arrange for specialists to evaluate your home. Having grab bars in the bedroom and bathroom is often important. Your health care provider may arrange for special equipment to be used at home, such as raised toilets and a seat for the shower.  · Do physical, occupational, and speech therapy as directed by your health care provider. Ongoing therapy may be needed to maximize your recovery.  · Use a walker or a cane at all times if directed by your health care provider  · Keep all follow-up visits as directed by your health care provider. This is important. This includes any referrals, physical therapy, rehabilitation, and laboratory tests.  SEEK IMMEDIATE MEDICAL CARE IF:   · You have a sudden, severe headache with no known cause.  · You have sudden:    Confusion.    Trouble speaking (aphasia) or understanding speech.    Weakness or numbness of the face, arm, or leg, especially on one side of the body.    Trouble seeing in one or both eyes.    Trouble walking or difficulty moving arms or legs.    Dizziness.  · You have a sudden loss of balance or coordination.  · You have a seizure.  · You have a partial or total loss of consciousness.  These symptoms may represent a serious problem that is an emergency. Do not wait to see if the symptoms will go away. Get medical help right away. Call your local emergency services (911 in the [place] Do not drive yourself to the hospital.     This information is not intended to replace advice given to you by your health care provider. Make sure you discuss any questions you have with your health care provider.     Document Released: 11/03/2013 Document Reviewed: 11/03/2013  Elsevier Interactive Patient Education ©2016 [employer]

## 2015-07-19 ENCOUNTER — Encounter (HOSPITAL_COMMUNITY): Payer: Self-pay

## 2016-03-22 ENCOUNTER — Other Ambulatory Visit (HOSPITAL_COMMUNITY): Payer: Self-pay | Admitting: Family Medicine

## 2016-03-22 DIAGNOSIS — Z1231 Encounter for screening mammogram for malignant neoplasm of breast: Secondary | ICD-10-CM

## 2016-04-05 ENCOUNTER — Ambulatory Visit (HOSPITAL_COMMUNITY)
Admission: RE | Admit: 2016-04-05 | Discharge: 2016-04-05 | Disposition: A | Discharge status: Home / Self Care | Payer: Medicare Other | Source: Ambulatory Visit | Attending: Family Medicine | Admitting: Family Medicine

## 2016-04-05 DIAGNOSIS — Z1231 Encounter for screening mammogram for malignant neoplasm of breast: Secondary | ICD-10-CM | POA: Insufficient documentation

## 2016-10-02 IMAGING — DX DG CHEST 2V
2 series · 2 of 2 positions shown · non-contrast
Comparison: 03/12/2015

CLINICAL DATA: Worsening nonproductive cough

EXAM:
CHEST  2 VIEW

[chest pa]
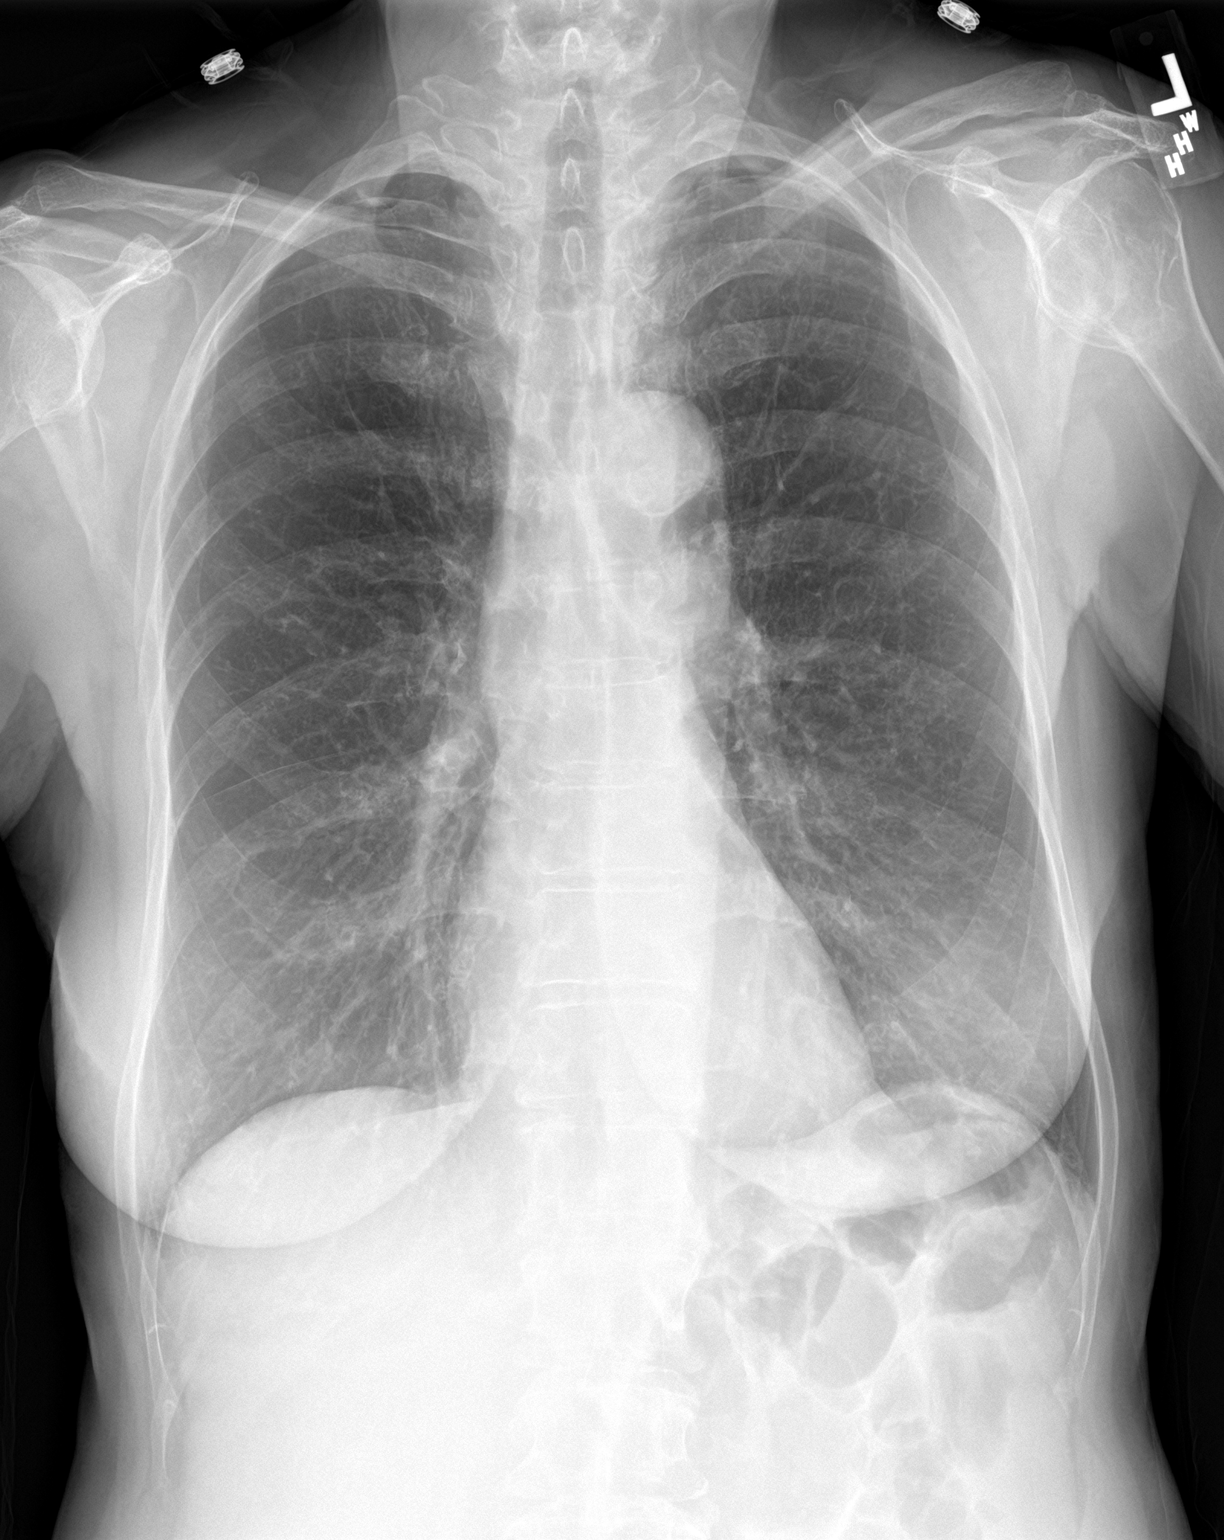

[chest lat]
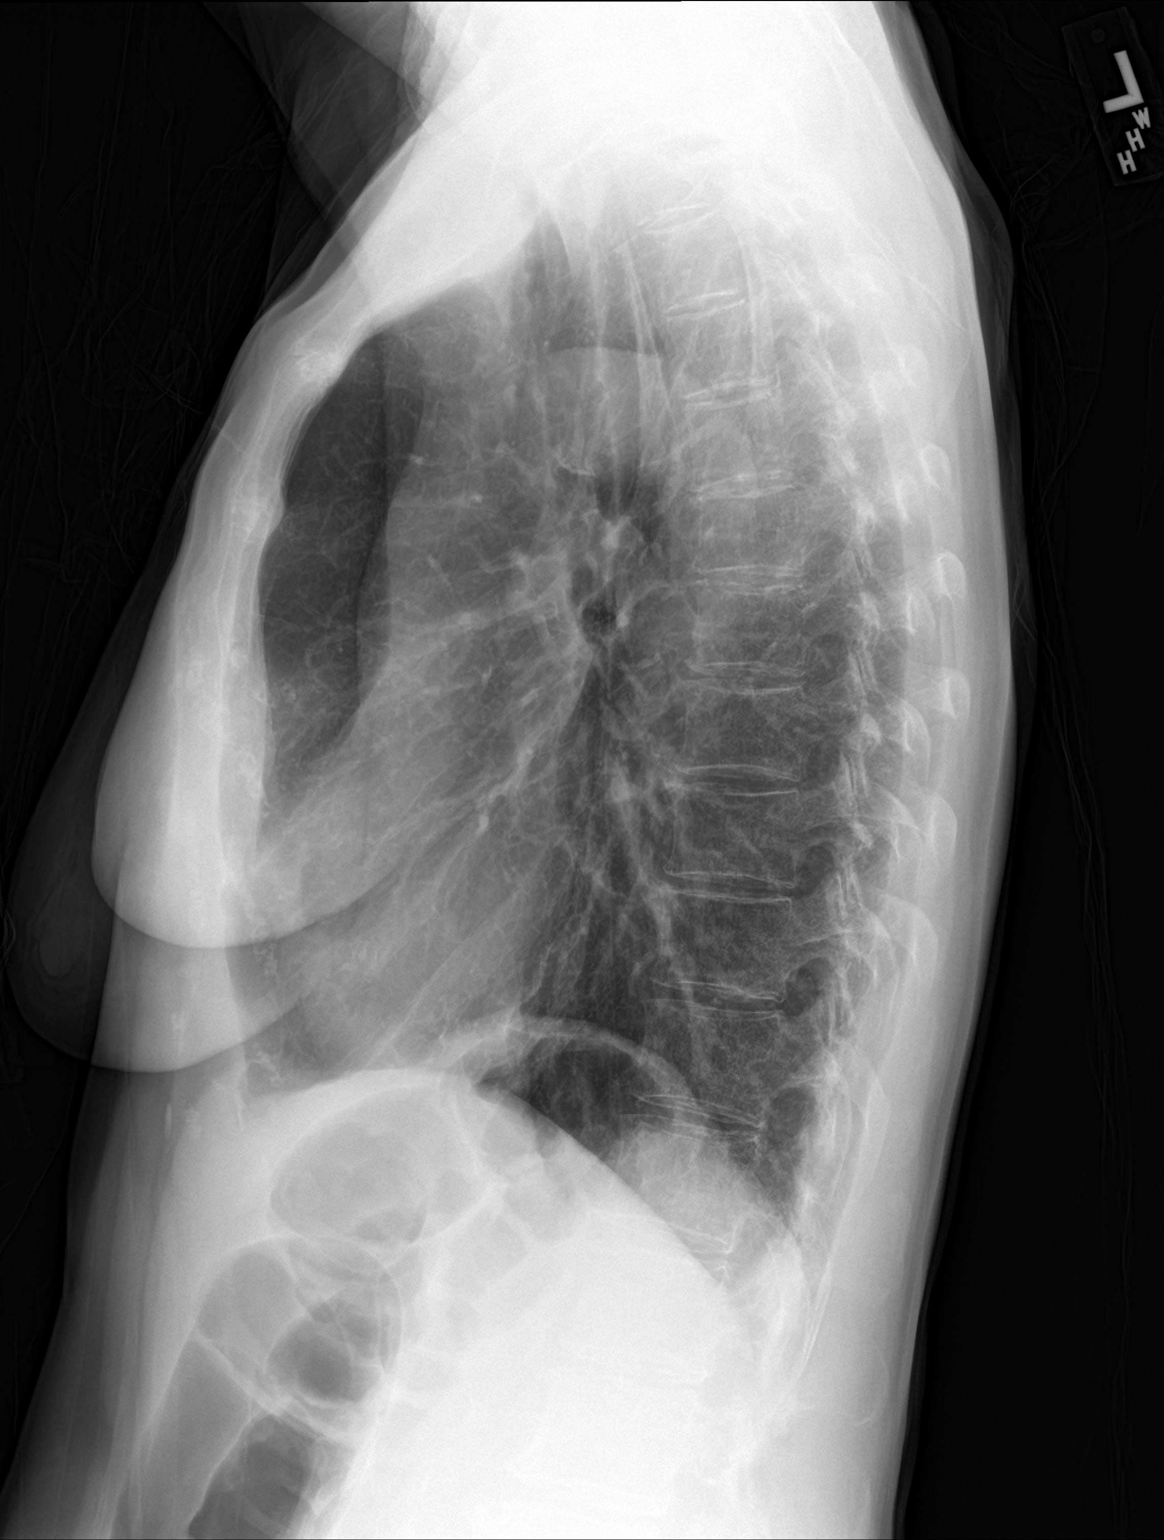

[2 of 2 positions shown; findings below may reference images not displayed]

FINDINGS: There is hyperinflation and upper lobe emphysematous change. No
confluent airspace consolidation. No effusion. Hilar, mediastinal
and cardiac contours are unremarkable and unchanged.
IMPRESSION: Hyperinflation and upper lobe emphysematous changes. No acute
cardiopulmonary findings.

## 2017-03-17 ENCOUNTER — Other Ambulatory Visit (HOSPITAL_COMMUNITY): Payer: Self-pay | Admitting: Family Medicine

## 2017-03-17 DIAGNOSIS — Z1231 Encounter for screening mammogram for malignant neoplasm of breast: Secondary | ICD-10-CM

## 2017-04-11 ENCOUNTER — Ambulatory Visit (HOSPITAL_COMMUNITY): Payer: Medicare Other

## 2017-10-12 ENCOUNTER — Emergency Department (HOSPITAL_COMMUNITY)
Admission: EM | Admit: 2017-10-12 | Discharge: 2017-10-12 | Disposition: A | Discharge status: Home / Self Care | Payer: Medicare Other | Attending: Emergency Medicine | Admitting: Emergency Medicine

## 2017-10-12 ENCOUNTER — Other Ambulatory Visit: Payer: Self-pay

## 2017-10-12 ENCOUNTER — Emergency Department (HOSPITAL_COMMUNITY): Payer: Medicare Other

## 2017-10-12 ENCOUNTER — Encounter (HOSPITAL_COMMUNITY): Payer: Self-pay | Admitting: Emergency Medicine

## 2017-10-12 DIAGNOSIS — I10 Essential (primary) hypertension: Secondary | ICD-10-CM | POA: Insufficient documentation

## 2017-10-12 DIAGNOSIS — S42294A Other nondisplaced fracture of upper end of right humerus, initial encounter for closed fracture: Secondary | ICD-10-CM | POA: Diagnosis not present

## 2017-10-12 DIAGNOSIS — W01198A Fall on same level from slipping, tripping and stumbling with subsequent striking against other object, initial encounter: Secondary | ICD-10-CM | POA: Diagnosis not present

## 2017-10-12 DIAGNOSIS — F0390 Unspecified dementia without behavioral disturbance: Secondary | ICD-10-CM | POA: Insufficient documentation

## 2017-10-12 DIAGNOSIS — Y9289 Other specified places as the place of occurrence of the external cause: Secondary | ICD-10-CM | POA: Insufficient documentation

## 2017-10-12 DIAGNOSIS — Y999 Unspecified external cause status: Secondary | ICD-10-CM | POA: Insufficient documentation

## 2017-10-12 DIAGNOSIS — S42291A Other displaced fracture of upper end of right humerus, initial encounter for closed fracture: Secondary | ICD-10-CM

## 2017-10-12 DIAGNOSIS — S4991XA Unspecified injury of right shoulder and upper arm, initial encounter: Secondary | ICD-10-CM | POA: Diagnosis present

## 2017-10-12 DIAGNOSIS — Y9389 Activity, other specified: Secondary | ICD-10-CM | POA: Diagnosis not present

## 2017-10-12 DIAGNOSIS — W19XXXA Unspecified fall, initial encounter: Secondary | ICD-10-CM

## 2017-10-12 NOTE — ED Notes (Signed)
Pt transported to Xray. 

## 2017-10-12 NOTE — Discharge Instructions (Signed)
You were seen in the ED today after a fall. You have fractured the neck of the humerus. This should heal on its own. We are providing a sling for comfort. You will need to call the orthopedic doctor listed tomorrow to schedule a follow up appointment.   Take Tylenol for pain. Return to the ED with any additional falls, numbness, or other injuries.

## 2017-10-12 NOTE — ED Provider Notes (Signed)
Emergency Department Provider Note   I have reviewed the triage vital signs and the nursing notes.   HISTORY  Chief Complaint Fall   HPI Dustin FlockBarbara C Talerico is a 72 y.o. female with PMH of dementia, HLD, HTN, and prior CVA presents to the emergency department for evaluation of right arm discomfort after mechanical fall today.  She states that she tripped while fell landing primarily on her right arm.  She denies putting out her hand to try and stop her self.  She denies head injury or loss of consciousness.  She is having pain primarily in the upper right arm but notes some discomfort in the elbow as well.  No wrist pain.  No tingling or numbness in the right arm.  No leg discomfort.  Denies any chest pain, shortness of breath, palpitations, other presyncopal symptoms prior to fall.    Past Medical History:  Diagnosis Date  . Dementia   . 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 Hypercholesteremia   . Hypertension   . Stroke [facility](HCC)     Patient Active Problem List   Diagnosis Date Noted  . 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 Dementia 03/15/2015  . Thrombocytopenia (HCC) 03/15/2015  . Hypokalemia 03/14/2015  . Seizures (HCC)   . Cerebral amyloid angiopathy (HCC)   . HLD (hyperlipidemia)   . Essential hypertension   . 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 Proximal humerus fracture 02/28/2014    History reviewed. No pertinent surgical history.  Current Outpatient Rx  . Order #: [id number] Class: Historical Med  . Order #: [id number] Class: Historical Med  . Order #: [id number] Class: Normal  . Order #: [id number] Class: Historical Med  . Order #: [id number] Class: Historical Med    Allergies Patient has no known allergies.  Family History  Problem Relation Age of Onset  . Hypertension Mother   . Heart attack Mother   . Lung  cancer Sister   . Hypertension Father   . Cancer Sister     Social History Social History   Tobacco Use  . Smoking status: Never Smoker  . Smokeless tobacco: Never Used  Substance Use Topics  . Alcohol use: No  . Drug use: No    Review of Systems  Constitutional: No fever/chills Eyes: No visual changes. ENT: No sore throat. Cardiovascular: Denies chest pain. Respiratory: Denies shortness of breath. Gastrointestinal: No abdominal pain.  No nausea, no vomiting.  No diarrhea.  No constipation. Genitourinary: Negative for dysuria. Musculoskeletal: Negative for back pain. Positive right arm pain.  Skin: Negative for rash. Neurological: Negative for headaches, focal weakness or numbness.  10-point ROS otherwise negative.  ____________________________________________   PHYSICAL EXAM:  VITAL SIGNS: ED Triage Vitals  Enc Vitals Group     BP 10/12/17 1834 (!) 161/85     Pulse Rate 10/12/17 1834 73     Resp 10/12/17 1834 17     Temp 10/12/17 1834 97.7 F (36.5 C)     Temp Source 10/12/17 1834 Oral     SpO2 10/12/17 1834 100 %     Weight 10/12/17 1835 145 lb (65.8 kg)     Height 10/12/17 1835 5\' 4"  (1.626 m)   Constitutional: Alert and oriented. Well appearing and in no acute distress. Eyes: Conjunctivae are normal.  Head: Atraumatic. Nose: No congestion/rhinnorhea. Mouth/Throat: Mucous membranes are moist.  Oropharynx non-erythematous. Neck: No stridor.  Cardiovascular: Normal rate, regular rhythm. Good peripheral circulation. Grossly normal heart sounds.   Respiratory: Normal respiratory effort.  No retractions. Lungs CTAB. Gastrointestinal: Soft and nontender. No distention.  Musculoskeletal: No lower extremity tenderness nor edema. No gross deformities of extremities. Patient hold right arm close to her body with pain over the right shoulder and elbow. No wrist discomfort. No snuff box tenderness. Normal ROM of bilateral hips.  Neurologic:  Normal speech and  language. No gross focal neurologic deficits are appreciated.  Skin:  Skin is warm, dry and intact. No rash noted.  ____________________________________________  RADIOLOGY  Dg Shoulder Right  Result Date: 10/12/2017 CLINICAL DATA:  Right arm pain following a fall today. EXAM: RIGHT SHOULDER - 2+ VIEW COMPARISON:  Right humerus radiographs obtained at the same time. FINDINGS: Mildly impacted right humeral head and neck fracture involving the neck and greater tuberosity. No significant displacement or angulation. No dislocation. Diffuse osteopenia. IMPRESSION: Mildly comminuted and mildly impacted right humeral head and neck fracture, as described above. Electronically Signed   By: Beckie Salts M.D.   On: 10/12/2017 19:52   Dg Elbow 2 Views Right  Result Date: 10/12/2017 CLINICAL DATA:  Right arm pain following a fall today. EXAM: RIGHT ELBOW - 2 VIEW COMPARISON:  Right humerus radiographs obtained at the same time. FINDINGS: AP and lateral views demonstrate normal appearing bones and soft tissues with the exception of mild diffuse osteopenia. No fracture, dislocation or effusion seen. IMPRESSION: No fracture or effusion. Electronically Signed   By: Beckie Salts M.D.   On: 10/12/2017 19:53   Dg Humerus Right  Result Date: 10/12/2017 CLINICAL DATA:  Right arm pain following a fall today. EXAM: RIGHT HUMERUS - 2+ VIEW COMPARISON:  Right shoulder radiographs obtained at the same time. FINDINGS: Mildly impacted and mildly comminuted right humeral head and neck fracture involving the neck and greater tuberosity. No significant displacement or angulation. Diffuse osteopenia. IMPRESSION: Essentially nondisplaced and mildly impacted right humeral head and neck fracture. Electronically Signed   By: Beckie Salts M.D.   On: 10/12/2017 19:51    ____________________________________________   PROCEDURES  Procedure(s) performed:    Procedures  None ____________________________________________   INITIAL IMPRESSION / ASSESSMENT AND PLAN / ED COURSE  Pertinent labs & imaging results that were available during my care of the patient were reviewed by me and considered in my medical decision making (see chart for details).  Patient presents to the emergency department for evaluation of right arm pain after mechanical fall.  No head injury or loss of consciousness.  No clear deformity to the right upper extremity.  Plain films obtained and reads are pending.   Plain film shows impacted right humeral neck fracture.  No additional plain film abnormalities.  Placed the patient in a sling.  Her pain is well controlled without medications at this time.  Daughter at bedside states that she typically takes Tylenol for pain and given her dementia and fall today would try and get by without additional stronger pain medication.  Patient given contact information for orthopedics for follow up as an outpatient.   At this time, I do not feel there is any life-threatening condition present. I have reviewed and discussed all results (EKG, imaging, lab, urine as appropriate), exam findings with patient. I have reviewed nursing notes and appropriate previous records.  I feel the patient is safe to be discharged home without further emergent workup. Discussed usual and customary return precautions. Patient and family (if present) verbalize understanding and  are comfortable with this plan.  Patient will follow-up with their primary care provider. If they do not have a primary care provider, information for follow-up has been provided to them. All questions have been answered.  ____________________________________________  FINAL CLINICAL IMPRESSION(S) / ED DIAGNOSES  Final diagnoses:  Closed fracture of head of right humerus, initial encounter  Fall, initial encounter    Note:  This document was prepared using Dragon voice recognition  software and may include unintentional dictation errors.  Alona Bene, MD Emergency Medicine    Aron Needles, Arlyss Repress, MD 10/12/17 2204

## 2017-10-12 NOTE — ED Notes (Signed)
Pt ambulatory to waiting room. Pt verbalized understanding of discharge instructions.   

## 2017-10-12 NOTE — ED Triage Notes (Addendum)
Patient c/o right arm pain after falling today. Denies hitting head, LOC, or dizziness. Denies taking any blood thinners. No obvious deformity.

## 2017-10-31 ENCOUNTER — Emergency Department (HOSPITAL_COMMUNITY): Payer: Medicare Other

## 2017-10-31 ENCOUNTER — Other Ambulatory Visit: Payer: Self-pay

## 2017-10-31 ENCOUNTER — Emergency Department (HOSPITAL_COMMUNITY)
Admission: EM | Admit: 2017-10-31 | Discharge: 2017-10-31 | Disposition: A | Discharge status: Home / Self Care | Payer: Medicare Other | Attending: Emergency Medicine | Admitting: Emergency Medicine

## 2017-10-31 ENCOUNTER — Encounter (HOSPITAL_COMMUNITY): Payer: Self-pay

## 2017-10-31 DIAGNOSIS — S42201D Unspecified fracture of upper end of right humerus, subsequent encounter for fracture with routine healing: Secondary | ICD-10-CM

## 2017-10-31 DIAGNOSIS — W19XXXA Unspecified fall, initial encounter: Secondary | ICD-10-CM | POA: Insufficient documentation

## 2017-10-31 DIAGNOSIS — Y939 Activity, unspecified: Secondary | ICD-10-CM | POA: Insufficient documentation

## 2017-10-31 DIAGNOSIS — Z79899 Other long term (current) drug therapy: Secondary | ICD-10-CM | POA: Diagnosis not present

## 2017-10-31 DIAGNOSIS — E876 Hypokalemia: Secondary | ICD-10-CM | POA: Diagnosis not present

## 2017-10-31 DIAGNOSIS — M25552 Pain in left hip: Secondary | ICD-10-CM | POA: Diagnosis present

## 2017-10-31 DIAGNOSIS — Y92003 Bedroom of unspecified non-institutional (private) residence as the place of occurrence of the external cause: Secondary | ICD-10-CM | POA: Diagnosis not present

## 2017-10-31 DIAGNOSIS — Y999 Unspecified external cause status: Secondary | ICD-10-CM | POA: Insufficient documentation

## 2017-10-31 DIAGNOSIS — N3090 Cystitis, unspecified without hematuria: Secondary | ICD-10-CM | POA: Diagnosis not present

## 2017-10-31 DIAGNOSIS — F0390 Unspecified dementia without behavioral disturbance: Secondary | ICD-10-CM | POA: Insufficient documentation

## 2017-10-31 DIAGNOSIS — M545 Low back pain: Secondary | ICD-10-CM | POA: Diagnosis not present

## 2017-10-31 DIAGNOSIS — Y92009 Unspecified place in unspecified non-institutional (private) residence as the place of occurrence of the external cause: Secondary | ICD-10-CM

## 2017-10-31 DIAGNOSIS — I10 Essential (primary) hypertension: Secondary | ICD-10-CM | POA: Insufficient documentation

## 2017-10-31 LAB — URINALYSIS, ROUTINE W REFLEX MICROSCOPIC
BILIRUBIN URINE: NEGATIVE
GLUCOSE, UA: NEGATIVE mg/dL
Hgb urine dipstick: NEGATIVE
KETONES UR: 20 mg/dL — AB
Nitrite: NEGATIVE
PROTEIN: NEGATIVE mg/dL
Specific Gravity, Urine: 1.024 (ref 1.005–1.030)
pH: 5 (ref 5.0–8.0)

## 2017-10-31 LAB — TROPONIN I: Troponin I: <0.03 ng/mL (ref <0.03)

## 2017-10-31 LAB — COMPREHENSIVE METABOLIC PANEL
ALBUMIN: 4.2 g/dL (ref 3.5–5.0)
ALK PHOS: 128 U/L — AB (ref 38–126)
ALT: 9 U/L (ref 0–44)
AST: 16 U/L (ref 15–41)
Anion gap: 11 (ref 5–15)
BUN: 12 mg/dL (ref 8–23)
CALCIUM: 8.8 mg/dL — AB (ref 8.9–10.3)
CO2: 26 mmol/L (ref 22–32)
CREATININE: 0.67 mg/dL (ref 0.44–1.00)
Chloride: 102 mmol/L (ref 98–111)
GFR calc Af Amer: >60 mL/min (ref >60)
GFR calc non Af Amer: >60 mL/min (ref >60)
GLUCOSE: 106 mg/dL — AB (ref 70–99)
Potassium: 2.7 mmol/L — CL (ref 3.5–5.1)
Sodium: 139 mmol/L (ref 135–145)
Total Bilirubin: 0.9 mg/dL (ref 0.3–1.2)
Total Protein: 7.6 g/dL (ref 6.5–8.1)

## 2017-10-31 LAB — CBC WITH DIFFERENTIAL/PLATELET
BASOS PCT: 0 %
Basophils Absolute: 0 10*3/uL (ref 0.0–0.1)
Eosinophils Absolute: 0 10*3/uL (ref 0.0–0.7)
Eosinophils Relative: 0 %
HCT: 39.2 % (ref 36.0–46.0)
Hemoglobin: 13 g/dL (ref 12.0–15.0)
LYMPHS ABS: 0.5 10*3/uL — AB (ref 0.7–4.0)
Lymphocytes Relative: 6 %
MCH: 31 pg (ref 26.0–34.0)
MCHC: 33.2 g/dL (ref 30.0–36.0)
MCV: 93.6 fL (ref 78.0–100.0)
MONO ABS: 0.6 10*3/uL (ref 0.1–1.0)
MONOS PCT: 6 %
Neutro Abs: 8.2 10*3/uL — ABNORMAL HIGH (ref 1.7–7.7)
Neutrophils Relative %: 88 %
Platelets: 173 10*3/uL (ref 150–400)
RBC: 4.19 MIL/uL (ref 3.87–5.11)
RDW: 15.3 % (ref 11.5–15.5)
WBC: 9.3 10*3/uL (ref 4.0–10.5)

## 2017-10-31 LAB — CK: Total CK: 47 U/L (ref 38–234)

## 2017-10-31 MED ORDER — POTASSIUM CHLORIDE CRYS ER 20 MEQ PO TBCR
40.0000 meq | EXTENDED_RELEASE_TABLET | Freq: Once | ORAL | Status: AC
  Administered 2017-10-31: 40 meq via ORAL
  Filled 2017-10-31: qty 2

## 2017-10-31 MED ORDER — POTASSIUM CHLORIDE ER 10 MEQ PO TBCR: 10.0000 meq | EXTENDED_RELEASE_TABLET | Freq: Two times a day (BID) | ORAL | 0 refills | Status: DC

## 2017-10-31 MED ORDER — CEPHALEXIN 500 MG PO CAPS: 500.0000 mg | ORAL_CAPSULE | Freq: Four times a day (QID) | ORAL | 0 refills | Status: DC

## 2017-10-31 NOTE — Discharge Instructions (Addendum)
Take the prescription as directed. Your potassium was low today and you were given replacement in the ED and by prescription. Call your regular medical doctor on Monday to schedule a follow up appointment next week to re-check your potassium level. Call the Orthopedic doctor on Monday to schedule a follow up appointment within the next week.   Return to the Emergency Department immediately sooner if worsening.

## 2017-10-31 NOTE — ED Notes (Signed)
Patient transported to X-ray 

## 2017-10-31 NOTE — ED Notes (Signed)
Patient and family refused sling.

## 2017-10-31 NOTE — ED Provider Notes (Signed)
[facility] EMERGENCY DEPARTMENT Provider Note   CSN: [id number] Arrival date & time: 10/31/17  1728     History   Chief Complaint Chief Complaint  Patient presents with  . Fall    HPI Shawna Banks is a 72 y.o. female.  The history is provided by the EMS personnel, the patient and a relative. The history is limited by the condition of the patient (hx dementia).  Fall   Pt was seen at 1730. Per EMS and family report: Pt lives alone. Pt's family spoke with her via telephone at 9am today. Pt's family went to check on her approximately PTA and found her laying across her bed, assuming she fell. Pt c/o left hip pain, low back pain. Pt also c/o continued right shoulder pain and bruising from previously dx proximal humeral fx 2 weeks ago. Pt not wearing right arm sling with EMS. Pt does have significant hx of dementia, does not recall any events and denies any complaints. Denies neck pain, no CP/SOB, no abd pain.   Past Medical History:  Diagnosis Date  . Dementia   . 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 Hypercholesteremia   . Hypertension   . Stroke [facility])     Patient Active Problem List   Diagnosis Date Noted  . 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 Dementia 03/15/2015  . Thrombocytopenia (HCC) 03/15/2015  . Hypokalemia 03/14/2015  . Seizures (HCC)   . Cerebral amyloid angiopathy (HCC)   . HLD (hyperlipidemia)   . Essential hypertension   . 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 Proximal humerus fracture 02/28/2014    History reviewed. No pertinent surgical history.   OB History   None      Home Medications    Prior to Admission medications   Medication Sig Start Date End Date Taking? Authorizing Provider  donepezil (ARICEPT) 10 MG tablet Take 10 mg by mouth at bedtime.    [provider]    enalapril (VASOTEC) 20 MG tablet Take 20 mg by mouth daily.    [provider]  levETIRAcetam (KEPPRA) 1000 MG tablet Take 1 tablet (1,000 mg total) by mouth 2 (two) times daily. 03/15/15   Layne Benton, NP  simvastatin (ZOCOR) 40 MG tablet Take 40 mg by mouth at bedtime.     [provider]  Vitamin D, Ergocalciferol, (DRISDOL) 50000 UNITS CAPS capsule Take 50,000 Units by mouth every 14 (fourteen) days. Every other Wednesday    [provider]    Family History Family History  Problem Relation Age of Onset  . Hypertension Mother   . Heart attack Mother   . Lung cancer Sister   . Hypertension Father   . Cancer Sister     Social History Social History   Tobacco Use  . Smoking status: Never Smoker  . Smokeless tobacco: Never Used  Substance Use Topics  . Alcohol use: No  . Drug use: No     Allergies   Patient has no known allergies.   Review of Systems Review of Systems  Unable to perform ROS: Dementia     Physical Exam Updated Vital Signs BP 138/71 (BP Location: Left Arm)   Pulse 78   Temp 98 F (36.7 C) (Oral)   Resp 18   SpO2 95%    18:11 Orthostatic Vital Signs TH  Orthostatic Lying  BP- Lying: 127/69   Pulse- Lying: 70       Orthostatic Sitting  BP- Sitting: 142/74   Pulse- Sitting: 84       Orthostatic Standing at 0 minutes  BP- Standing at 0 minutes: 135/68   Pulse- Standing at 0 minutes: 90     Physical Exam 1735: Physical examination: Vital signs and O2 SAT: Reviewed; Constitutional: Well developed, Well nourished, Well hydrated, In no acute distress; Head and Face: Normocephalic, Atraumatic; Eyes: EOMI, PERRL, No scleral icterus; ENMT: Mouth and pharynx normal, Mucous membranes moist; Neck: Supple, Trachea midline; Spine: +mild TTP right lumbar paraspinal muscles. No midline CS, TS, LS tenderness.; Cardiovascular: Regular rate and rhythm, No gallop; Respiratory: Breath sounds clear & equal bilaterally, No  wheezes, Normal respiratory effort/excursion; Chest: Nontender, No deformity, Movement normal, No crepitus, No abrasions or ecchymosis.; Abdomen: Soft, Nontender, Nondistended, Normal bowel sounds, No abrasions or ecchymosis.; Genitourinary: No CVA tenderness;; Extremities: No deformity, +TTP right proximal humerus with localized fading ecchymosis right upper and lower arm per recent fx.  Decreased ROM right shoulder d/t fx, otherwise full range of motion major/large joints of bilat UE's and LE's without pain or tenderness to palp, Neurovascularly intact, Pulses normal, No hips tenderness bilat. Pt is able to actively lift extended bilat LE's off stretcher. NT bilat knees/ankles/feet. No edema, Pelvis stable; Neuro: Awake, alert, confused per hx dementia. No facial droop. Major CN grossly intact. Speech clear. Right shoulder decreased ROM, otherwise no gross focal motor deficits in extremities.; Skin: Color normal, Warm, Dry    ED Treatments / Results  Labs (all labs ordered are listed, but only abnormal results are displayed)   EKG EKG Interpretation  Date/Time:  Friday October 31 2017 17:38:29 EDT Ventricular Rate:  63 PR Interval:    QRS Duration: 107 QT Interval:  452 QTC Calculation: 463 R Axis:   43 Text Interpretation:  Sinus rhythm RSR' in V1 or V2, right VCD or RVH Baseline wander Artifact When compared with ECG of 03/12/2015 No significant change was found Confirmed by Samuel Jester [phone]) on 10/31/2017 5:41:59 PM   Radiology   Procedures Procedures (including critical care time)  Medications Ordered in ED Medications - No data to display   Initial Impression / Assessment and Plan / ED Course  I have reviewed the triage vital signs and the nursing notes.  Pertinent labs & imaging results that were available during my care of the patient were reviewed by me and considered in my medical decision making (see chart for details).  MDM Reviewed: previous chart, nursing note  and vitals Reviewed previous: labs and ECG Interpretation: labs, ECG, x-ray and CT scan   Results for orders placed or performed during the hospital encounter of 10/31/17  Comprehensive metabolic panel  Result Value Ref Range   Sodium 139 135 - 145 mmol/L   Potassium 2.7 (LL) 3.5 - 5.1 mmol/L   Chloride 102 98 - 111 mmol/L   CO2 26 22 - 32 mmol/L   Glucose, Bld 106 (H) 70 - 99 mg/dL   BUN 12 8 - 23 mg/dL   Creatinine, Ser 6.04 0.44 - 1.00 mg/dL   Calcium 8.8 (L) 8.9 - 10.3 mg/dL   Total Protein 7.6 6.5 - 8.1 g/dL   Albumin 4.2 3.5 - 5.0 g/dL   AST 16 15 - 41 U/L   ALT 9 0 - 44 U/L   Alkaline Phosphatase 128 (H) 38 - 126 U/L   Total Bilirubin 0.9 0.3 - 1.2 mg/dL  GFR calc non Af Amer >60 >60 mL/min   GFR calc Af Amer >60 >60 mL/min   Anion gap 11 5 - 15  Troponin I  Result Value Ref Range   Troponin I <0.03 <0.03 ng/mL  CBC with Differential  Result Value Ref Range   WBC 9.3 4.0 - 10.5 K/uL   RBC 4.19 3.87 - 5.11 MIL/uL   Hemoglobin 13.0 12.0 - 15.0 g/dL   HCT 16.1 09.6 - 04.5 %   MCV 93.6 78.0 - 100.0 fL   MCH 31.0 26.0 - 34.0 pg   MCHC 33.2 30.0 - 36.0 g/dL   RDW 40.9 81.1 - 91.4 %   Platelets 173 150 - 400 K/uL   Neutrophils Relative % 88 %   Neutro Abs 8.2 (H) 1.7 - 7.7 K/uL   Lymphocytes Relative 6 %   Lymphs Abs 0.5 (L) 0.7 - 4.0 K/uL   Monocytes Relative 6 %   Monocytes Absolute 0.6 0.1 - 1.0 K/uL   Eosinophils Relative 0 %   Eosinophils Absolute 0.0 0.0 - 0.7 K/uL   Basophils Relative 0 %   Basophils Absolute 0.0 0.0 - 0.1 K/uL  Urinalysis, Routine w reflex microscopic  Result Value Ref Range   Color, Urine YELLOW YELLOW   APPearance HAZY (A) CLEAR   Specific Gravity, Urine 1.024 1.005 - 1.030   pH 5.0 5.0 - 8.0   Glucose, UA NEGATIVE NEGATIVE mg/dL   Hgb urine dipstick NEGATIVE NEGATIVE   Bilirubin Urine NEGATIVE NEGATIVE   Ketones, ur 20 (A) NEGATIVE mg/dL   Protein, ur NEGATIVE NEGATIVE mg/dL   Nitrite NEGATIVE NEGATIVE   Leukocytes, UA TRACE  (A) NEGATIVE   RBC / HPF 0-5 0 - 5 RBC/hpf   WBC, UA 6-10 0 - 5 WBC/hpf   Bacteria, UA RARE (A) NONE SEEN   Squamous Epithelial / LPF 0-5 0 - 5   Mucus PRESENT    Hyaline Casts, UA PRESENT   CK  Result Value Ref Range   Total CK 47 38 - 234 U/L   Dg Chest 2 View Result Date: 10/31/2017 CLINICAL DATA:  Initial evaluation for acute trauma, fall. EXAM: CHEST - 2 VIEW COMPARISON:  Prior radiograph from 03/26/2015. FINDINGS: Cardiac and mediastinal silhouettes are stable in size and contour, and remain within normal limits. Aortic atherosclerosis. Lungs are mildly hypoinflated. No focal infiltrates. No pulmonary edema or pleural effusion. No pneumothorax. No acute osseous abnormality.  Osteopenia. IMPRESSION: 1. No active cardiopulmonary disease. 2. Aortic atherosclerosis. Electronically Signed   By: Rise Mu M.D.   On: 10/31/2017 19:03   Dg Lumbar Spine Complete Result Date: 10/31/2017 CLINICAL DATA:  Dementia.  Fall.  Back pain. EXAM: LUMBAR SPINE - COMPLETE 4+ VIEW COMPARISON:  None FINDINGS: Normal alignment. No evidence of lower thoracic or lumbar fracture. No significant degenerative changes. Ordinary aortic atherosclerosis noted. IMPRESSION: Negative. Electronically Signed   By: Paulina Fusi M.D.   On: 10/31/2017 19:03   Dg Shoulder Right Result Date: 10/31/2017 CLINICAL DATA:  Found on the ground.  Previous shoulder fracture EXAM: RIGHT SHOULDER - 2+ VIEW COMPARISON:  10/12/2017 FINDINGS: Allowing for differences in projection, no change is appreciated relating to the surgical neck and greater tuberosity fracture of the humerus. Other regional bone structures appear normal. IMPRESSION: Surgical neck and greater tuberosity humeral fractures as seen previously. No appreciable change. Electronically Signed   By: Paulina Fusi M.D.   On: 10/31/2017 19:04    Ct Head Wo Contrast Result Date: 10/31/2017 CLINICAL  DATA:  Found on the ground.  Dementia. EXAM: CT HEAD WITHOUT CONTRAST CT  CERVICAL SPINE WITHOUT CONTRAST TECHNIQUE: Multidetector CT imaging of the head and cervical spine was performed following the standard protocol without intravenous contrast. Multiplanar CT image reconstructions of the cervical spine were also generated. COMPARISON:  03/12/2015 FINDINGS: CT HEAD FINDINGS Brain: No acute or traumatic finding. Generalized atrophy. Chronic small-vessel ischemic changes of the hemispheric white matter. Focal atrophy in the left posterior temporal and temporoparietal junction region at the site of a previous hemorrhage. No evidence of acute infarction, mass lesion, acute hemorrhage, hydrocephalus or extra-axial collection. Vascular: There is atherosclerotic calcification of the major vessels at the base of the brain. Skull: Normal Sinuses/Orbits: Few scattered opacified ethmoid air cells. No significant sinus disease. Orbits negative. Other: None CT CERVICAL SPINE FINDINGS Alignment: No traumatic malalignment. Skull base and vertebrae: No fracture or focal lesion. Soft tissues and spinal canal: No significant soft tissue finding. Disc levels: Degenerative spondylosis with disc space narrowing and marginal osteophytes from C2-3 through C6-7. No critical bony stenosis identified. There is foraminal stenosis most pronounced at C6-7. Upper chest: Negative Other: None IMPRESSION: Head CT: No acute or traumatic finding. Atrophy and chronic small-vessel ischemic changes. Focal atrophy at the left temporoparietal junction at the site of a previous hemorrhage. Cervical spine CT: No acute or traumatic finding. Ordinary degenerative spondylosis. Electronically Signed   By: Paulina FusiMark  Shogry M.D.   On: 10/31/2017 19:09   Ct Cervical Spine Wo Contrast Result Date: 10/31/2017 CLINICAL DATA:  Found on the ground.  Dementia. EXAM: CT HEAD WITHOUT CONTRAST CT CERVICAL SPINE WITHOUT CONTRAST TECHNIQUE: Multidetector CT imaging of the head and cervical spine was performed following the standard protocol  without intravenous contrast. Multiplanar CT image reconstructions of the cervical spine were also generated. COMPARISON:  03/12/2015 FINDINGS: CT HEAD FINDINGS Brain: No acute or traumatic finding. Generalized atrophy. Chronic small-vessel ischemic changes of the hemispheric white matter. Focal atrophy in the left posterior temporal and temporoparietal junction region at the site of a previous hemorrhage. No evidence of acute infarction, mass lesion, acute hemorrhage, hydrocephalus or extra-axial collection. Vascular: There is atherosclerotic calcification of the major vessels at the base of the brain. Skull: Normal Sinuses/Orbits: Few scattered opacified ethmoid air cells. No significant sinus disease. Orbits negative. Other: None CT CERVICAL SPINE FINDINGS Alignment: No traumatic malalignment. Skull base and vertebrae: No fracture or focal lesion. Soft tissues and spinal canal: No significant soft tissue finding. Disc levels: Degenerative spondylosis with disc space narrowing and marginal osteophytes from C2-3 through C6-7. No critical bony stenosis identified. There is foraminal stenosis most pronounced at C6-7. Upper chest: Negative Other: None IMPRESSION: Head CT: No acute or traumatic finding. Atrophy and chronic small-vessel ischemic changes. Focal atrophy at the left temporoparietal junction at the site of a previous hemorrhage. Cervical spine CT: No acute or traumatic finding. Ordinary degenerative spondylosis. Electronically Signed   By: Paulina FusiMark  Shogry M.D.   On: 10/31/2017 19:09    Dg Hips Bilat W Or Wo Pelvis 5 Views Result Date: 10/31/2017 CLINICAL DATA:  Dementia.  Found on the ground.  Hip pain. EXAM: DG HIP (WITH OR WITHOUT PELVIS) 5+V BILAT COMPARISON:  None. FINDINGS: No evidence of pelvic or hip fracture. No significant degenerative changes. Mild hip osteoarthritis. IMPRESSION: Negative. Electronically Signed   By: Paulina FusiMark  Shogry M.D.   On: 10/31/2017 19:05    2205:  Potassium repleted PO. Pt  has tol PO well while in the ED without N/V. Abd  remains benign, resps easy, VSS. Pt not orthostatic on VS. Pt has ambulated with steady gait, easy resps.  Feels better and wants to go home now. Family states they have sling at home she can put on. Unclear UTI on Udip and pt with hx dementia so difficult to ascertain if symptomatic; will tx abx.  Dx and testing d/w pt and family.  Questions answered.  Verb understanding, agreeable to d/c home with outpt f/u.          Final Clinical Impressions(s) / ED Diagnoses   Final diagnoses:  None    ED Discharge Orders    None       Samuel Jester, DO 11/02/17 1737

## 2017-10-31 NOTE — ED Triage Notes (Signed)
EMS reports pt lives alone and daughter checked on her approx 30 min ago and found her laying across bed and bruise to r arm and fracture to r shoulder from previous fall.  Pt has dementia and doesn't remember falling.  Family talked to pt at 9am and was fine.  Pt fell sometime between noon and 5pm.  Pt c/o pain to left hip and lower back.  EMS says they assumed pt fell.  VSS.

## 2017-10-31 NOTE — ED Notes (Signed)
Date and time results received: 10/31/17 1925 (use smartphrase ".now" to insert current time)  Test: potassium Critical Value: 2.7  Name of Provider Notified: Dr. Clarene DukeMcManus notified at 1949  Orders Received? Or Actions Taken?: no/na

## 2017-10-31 NOTE — ED Notes (Signed)
Pt was informed that we need a urine sample. Pt states that she can not urinate at this time. 

## 2017-10-31 NOTE — ED Notes (Signed)
Patient is tolerating oral fluids  

## 2017-10-31 NOTE — ED Notes (Signed)
Patient ambulated to the bathroom and back to the stretcher.

## 2017-11-02 LAB — URINE CULTURE

## 2017-11-14 ENCOUNTER — Encounter (HOSPITAL_COMMUNITY): Payer: Self-pay

## 2017-11-14 ENCOUNTER — Ambulatory Visit (HOSPITAL_COMMUNITY): Payer: Medicare Other | Attending: Orthopedic Surgery

## 2017-11-14 ENCOUNTER — Other Ambulatory Visit: Payer: Self-pay

## 2017-11-14 DIAGNOSIS — M25611 Stiffness of right shoulder, not elsewhere classified: Secondary | ICD-10-CM | POA: Insufficient documentation

## 2017-11-14 DIAGNOSIS — R2991 Unspecified symptoms and signs involving the musculoskeletal system: Secondary | ICD-10-CM | POA: Insufficient documentation

## 2017-11-14 DIAGNOSIS — M25511 Pain in right shoulder: Secondary | ICD-10-CM | POA: Diagnosis present

## 2017-11-14 NOTE — Therapy (Signed)
[facility] [address] [place], [place], [address] Phone: [phone]   Fax:  [phone]  Occupational Therapy Evaluation  Patient Details  Name: Shawna Banks MRN: [id number] Date of Birth: May 27, 1945 Referring Provider: Duwayne Heck, MD   Encounter Date: 11/14/2017  OT End of Session - 11/14/17 1255    Visit Number  1    Number of Visits  5    Date for OT Re-Evaluation  12/12/17    Authorization Type  [employer] Medicare; $40 copay    Authorization Time Period  visits based on medical neccesity    OT Start Time  1117    OT Stop Time  1152    OT Time Calculation (min)  35 min    Activity Tolerance  Patient tolerated treatment well    Behavior During Therapy  [facility] for tasks assessed/performed       Past Medical History:  Diagnosis Date  . Dementia   . 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 Hypercholesteremia   . Hypertension   . Stroke [facility])     History reviewed. No pertinent surgical history.  There were no vitals filed for this visit.  Subjective Assessment - 11/14/17 1117    Subjective   S: I just do everything with the left hand. It's the dominant one.    Patient is accompained by:  Family member    Pertinent History  Patient is a 72 y/o female s/p a right humeral head fracture. Patient reports she fell in her kitchen at the end of June while home alone causing the fracture. Patient was given a sling to wear but daughter reports she seldom wore it. Patient is a poor historian due to hx of dementia. Dr. Aundria Rud has referred patient for evaluation and treatment.     Patient Stated Goals  Patient wished to reduce pain with movement.    Currently in Pain?  No/denies pain with movement but patient unable to quantify        [facility] OT Assessment - 11/14/17 1117      Assessment   Medical Diagnosis  right humeral fracture    Referring Provider  Duwayne Heck, MD    Onset  Date/Surgical Date  10/12/17    Hand Dominance  Left    Next MD Visit  -- August    Prior Therapy  none      Precautions   Precautions  Shoulder    Precaution Comments  progress as tolerated; no lifting over 2#      Restrictions   Weight Bearing Restrictions  Yes    RUE Weight Bearing  Weight bearing as tolerated    Other Position/Activity Restrictions  nothing over 2#      Balance Screen   Has the patient fallen in the past 6 months  Yes    How many times?  1    Has the patient had a decrease in activity level because of a fear of falling?   No    Is the patient reluctant to leave their home because of a fear of falling?   No      Home  Environment   Family/patient expects to be discharged to:  Private residence    Lives With  Alone      Prior Function   Level of Independence  Independent    Vocation  Retired    Leisure  Patient enjoys doing crossword puzzles, puzzles, housework,  and going out into the community.      ADL   ADL comments  Patient reports she mainly uses her left arm and is able to complete all ADLs      Mobility   Mobility Status  Independent      Vision - History   Baseline Vision  No visual deficits      Cognition   Overall Cognitive Status  History of cognitive impairments - at baseline      ROM / Strength   AROM / PROM / Strength  AROM;PROM;Strength      Palpation   Palpation comment  Moderate fascial restrictions palpated in upper arm, trapezius, and upper trapezius regions.      AROM   Overall AROM Comments  assessed seated; IR/er adducted    AROM Assessment Site  Shoulder    Right/Left Shoulder  Right    Right Shoulder Flexion  79 Degrees    Right Shoulder ABduction  69 Degrees    Right Shoulder Internal Rotation  90 Degrees    Right Shoulder External Rotation  47 Degrees      PROM   Overall PROM Comments  assessed supine; IR/er adducted    PROM Assessment Site  Shoulder    Right/Left Shoulder  Right    Right Shoulder Flexion  85  Degrees    Right Shoulder ABduction  70 Degrees    Right Shoulder Internal Rotation  90 Degrees    Right Shoulder External Rotation  42 Degrees      Strength   Strength Assessment Site  Shoulder    Right/Left Shoulder  Right    Right Shoulder Flexion  3-/5    Right Shoulder ABduction  3-/5    Right Shoulder Internal Rotation  3-/5    Right Shoulder External Rotation  3-/5                      OT Education - 11/14/17 1255    Education Details  Patient given shoulder table stretches to complete as part of HEP.    Person(s) Educated  Patient;Child(ren)    Methods  Explanation;Demonstration;Verbal cues;Handout    Comprehension  Verbalized understanding;Returned demonstration       OT Short Term Goals - 11/14/17 1327      OT SHORT TERM GOAL #1   Title  Patient will be educated on HEP to increase functional performance with daily tasks and participation of right nondominant extremity.    Time  4    Period  Weeks    Status  New    Target Date  12/12/17      OT SHORT TERM GOAL #2   Title  Patient will increase RUE A/ROM to [facility] to increase use of right nondominant arm with dressing tasks.    Time  4    Period  Weeks    Status  New      OT SHORT TERM GOAL #3   Title  Patient will increase RUE strength to a 4/5 in order to be able to complete all household tasks with less difficulty.    Time  4    Period  Weeks    Status  New      OT SHORT TERM GOAL #4   Title  Patient will decrease fascial restrictions to a min amount or less in order to increase ROM and decrease pain to improve functional performance with daily tasks.    Time  4    Period  Weeks    Status  New      OT SHORT TERM GOAL #5   Title  Patient will increase P/ROM to [facility] in order to increase participation of nondominant left arm in functional activities.    Time  4    Period  Weeks    Status  New               Plan - 11/14/17 1302    Clinical Impression Statement  Patient is a 72 y/o  female s/p right humeral head fracture causing increased pain, decreased ROM, and decreased strength in the RUE. Patient reports pain only with movement but is unable to quantify. Patient reports she uses her dominant LUE for all ADLs at home so she is not experiencing any difficulties with completing her daily tasks. Patient has a hx of dementia and was accompanied by her daughter. Therapist originally reccomending 2X a week for 4 weeks but patient and family requesting 1X a week due to high copay. Patient to come for skilled OT services 1X a week for 4 weeks and then reassess and determine if additional visits are needed.     Occupational performance deficits (Please refer to evaluation for details):  Leisure;IADL's;ADL's    Rehab Potential  Good    Current Impairments/barriers affecting progress:  hx of dementia; nondominant arm    OT Frequency  1x / week    OT Duration  4 weeks    OT Treatment/Interventions  Self-care/ADL training;Electrical Stimulation;Therapeutic exercise;Moist Heat;Paraffin;Patient/family education;Energy conservation;Therapeutic activities;Cryotherapy;Ultrasound;Manual Therapy;Passive range of motion    Plan  P: Patient will benefit from skilled OT services to increase ROM, decrease pain, and increase strength in order to increase functional performance during daily tasks. Treatment plan: myofascial release, P/ROM, AA/ROM, A/ROM, and general shoulder and scapular strengthening. Complete FOTO next session and begin AA/ROM in supine.                      Clinical Decision Making  Several treatment options, min-mod task modification necessary    OT Home Exercise Plan  shoulder table stretches    Consulted and Agree with Plan of Care  Patient;Family member/caregiver    Family Member Consulted  daughter       Patient will benefit from skilled therapeutic intervention in order to improve the following deficits and impairments:  Pain, Increased fascial restrictions, Decreased  activity tolerance, Decreased endurance, Decreased range of motion, Decreased strength, Impaired UE functional use  Visit Diagnosis: Acute pain of right shoulder  Stiffness of right shoulder, not elsewhere classified  Unspecified symptoms and signs involving the musculoskeletal system    Problem List Patient Active Problem List   Diagnosis Date Noted  . 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 Dementia 03/15/2015  . Thrombocytopenia (HCC) 03/15/2015  . Hypokalemia 03/14/2015  . Seizures (HCC)   . Cerebral amyloid angiopathy (HCC)   . HLD (hyperlipidemia)   . Essential hypertension   . 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 Proximal humerus fracture 02/28/2014    Holley Raring, OT student 11/14/2017, 1:47 PM  [facility] [address] [place], [place], [address] Phone: [phone]   Fax:  [phone]  Name: Shawna Banks MRN: [id number] Date of Birth: 12-Mar-1946

## 2017-11-14 NOTE — Patient Instructions (Signed)
Complete 1-3 times a day  SHOULDER: Flexion On Table   Place hands on table, elbows straight. Move hips away from body. Press hands down into table. Hold ___ seconds. ___ reps per set, ___ sets per day, ___ days per week  Abduction (Passive)   With arm out to side, resting on table, lower head toward arm, keeping trunk away from table. Hold ____ seconds. Repeat ____ times. Do ____ sessions per day.  Copyright  VHI. All rights reserved.     Internal Rotation (Assistive)   Seated with elbow bent at right angle and held against side, slide arm on table surface in an inward arc. Repeat ____ times. Do ____ sessions per day. Activity: Use this motion to brush crumbs off the table.  Copyright  VHI. All rights reserved.

## 2017-11-17 ENCOUNTER — Encounter (HOSPITAL_COMMUNITY): Payer: Medicare Other

## 2017-11-18 ENCOUNTER — Encounter (HOSPITAL_COMMUNITY): Payer: Self-pay | Admitting: Occupational Therapy

## 2017-11-18 ENCOUNTER — Ambulatory Visit (HOSPITAL_COMMUNITY): Payer: Medicare Other | Admitting: Occupational Therapy

## 2017-11-18 DIAGNOSIS — R2991 Unspecified symptoms and signs involving the musculoskeletal system: Secondary | ICD-10-CM

## 2017-11-18 DIAGNOSIS — M25611 Stiffness of right shoulder, not elsewhere classified: Secondary | ICD-10-CM

## 2017-11-18 DIAGNOSIS — M25511 Pain in right shoulder: Secondary | ICD-10-CM

## 2017-11-18 NOTE — Therapy (Signed)
[facility] [address] [place], [place], [address] Phone: [phone]   Fax:  [phone]  Occupational Therapy Treatment  Patient Details  Name: Shawna Banks MRN: [id number] Date of Birth: Jun 03, 1945 Referring Provider: Duwayne Heck, MD   Encounter Date: 11/18/2017  OT End of Session - 11/18/17 1732    Visit Number  2    Number of Visits  5    Date for OT Re-Evaluation  12/12/17    Authorization Type  UHC Medicare; $40 copay    Authorization Time Period  visits based on medical neccesity    OT Start Time  1649    OT Stop Time  1729    OT Time Calculation (min)  40 min    Activity Tolerance  Patient tolerated treatment well    Behavior During Therapy  [facility] for tasks assessed/performed       Past Medical History:  Diagnosis Date  . Dementia   . 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 Hypercholesteremia   . Hypertension   . Stroke [facility])     History reviewed. No pertinent surgical history.  There were no vitals filed for this visit.  Subjective Assessment - 11/18/17 1648    Subjective   S: I do those exercises for my arm.     Currently in Pain?  No/denies         [facility] OT Assessment - 11/18/17 1647      Assessment   Medical Diagnosis  right humeral fracture      Precautions   Precautions  Shoulder    Precaution Comments  progress as tolerated; no lifting over 2#               OT Treatments/Exercises (OP) - 11/18/17 1652      Exercises   Exercises  Shoulder      Shoulder Exercises: Supine   Protraction  PROM;5 reps;AAROM;10 reps    Horizontal ABduction  PROM;5 reps;AAROM;10 reps    External Rotation  PROM;5 reps;AAROM;10 reps    Internal Rotation  PROM;5 reps;AAROM;10 reps    Flexion  PROM;5 reps;AAROM;10 reps    ABduction  PROM;5 reps;AAROM;10 reps      Shoulder Exercises: Standing   Protraction  AAROM;10 reps    Horizontal ABduction  AAROM;10  reps    External Rotation  AAROM;10 reps    Internal Rotation  AAROM;10 reps    Flexion  AAROM;10 reps    ABduction  AAROM;10 reps      Shoulder Exercises: Pulleys   Flexion  1 minute    ABduction  1 minute      Shoulder Exercises: ROM/Strengthening   Wall Wash  1'      Manual Therapy   Manual Therapy  Myofascial release    Manual therapy comments  completed separately from therapeutic exercises    Myofascial Release  Myofascial release to right upper arm, trapezius, and scapularis regions to decrease pain and fascial restrictions and improve joint range of motion.                OT Short Term Goals - 11/18/17 1735      OT SHORT TERM GOAL #1   Title  Patient will be educated on HEP to increase functional performance with daily tasks and participation of right nondominant extremity.    Time  4    Period  Weeks    Status  On-going  OT SHORT TERM GOAL #2   Title  Patient will increase RUE A/ROM to [facility] to increase use of right nondominant arm with dressing tasks.    Time  4    Period  Weeks    Status  On-going      OT SHORT TERM GOAL #3   Title  Patient will increase RUE strength to a 4/5 in order to be able to complete all household tasks with less difficulty.    Time  4    Period  Weeks    Status  On-going      OT SHORT TERM GOAL #4   Title  Patient will decrease fascial restrictions to a min amount or less in order to increase ROM and decrease pain to improve functional performance with daily tasks.    Time  4    Period  Weeks    Status  On-going      OT SHORT TERM GOAL #5   Title  Patient will increase P/ROM to [facility] in order to increase participation of nondominant left arm in functional activities.    Time  4    Period  Weeks    Status  On-going               Plan - 11/18/17 1733    Clinical Impression Statement  A: Initiated myofascial release, passive stretching, and AA/ROM exercises this session. Pt with increased pain at 50% range and  above, however tries to allow for greater stretch with cuing and encouragement. Pt requiring cuing during session for form and technique due to dementia and memory deficits. Rest breaks provided intermittently due to fatigue.     Plan  P: Continue with AA/ROM and update HEP. Add pvc pipe slide. FOTO       Patient will benefit from skilled therapeutic intervention in order to improve the following deficits and impairments:  Pain, Increased fascial restrictions, Decreased activity tolerance, Decreased endurance, Decreased range of motion, Decreased strength, Impaired UE functional use  Visit Diagnosis: Acute pain of right shoulder  Stiffness of right shoulder, not elsewhere classified  Unspecified symptoms and signs involving the musculoskeletal system    Problem List Patient Active Problem List   Diagnosis Date Noted  . 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 Dementia 03/15/2015  . Thrombocytopenia (HCC) 03/15/2015  . Hypokalemia 03/14/2015  . Seizures (HCC)   . Cerebral amyloid angiopathy (HCC)   . HLD (hyperlipidemia)   . Essential hypertension   . 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 Proximal humerus fracture 02/28/2014   Ezra SitesLeslie Lorice Lafave, OTR/L  [phone] 11/18/2017, 5:35 PM  [facility] [address] [place], [place], [address] Phone: [phone]   Fax:  [phone]  Name: Shawna FlockBarbara C Banks MRN: [id number] Date of Birth: 23-Mar-1946

## 2017-11-25 ENCOUNTER — Ambulatory Visit (HOSPITAL_COMMUNITY): Payer: Medicare Other

## 2017-11-27 ENCOUNTER — Encounter (HOSPITAL_COMMUNITY): Payer: Medicare Other

## 2017-11-28 ENCOUNTER — Encounter (HOSPITAL_COMMUNITY): Payer: Self-pay | Admitting: Occupational Therapy

## 2017-11-28 ENCOUNTER — Ambulatory Visit (HOSPITAL_COMMUNITY): Payer: Medicare Other | Attending: Orthopedic Surgery | Admitting: Occupational Therapy

## 2017-11-28 DIAGNOSIS — R2991 Unspecified symptoms and signs involving the musculoskeletal system: Secondary | ICD-10-CM | POA: Insufficient documentation

## 2017-11-28 DIAGNOSIS — M25511 Pain in right shoulder: Secondary | ICD-10-CM | POA: Diagnosis present

## 2017-11-28 DIAGNOSIS — M25611 Stiffness of right shoulder, not elsewhere classified: Secondary | ICD-10-CM | POA: Diagnosis present

## 2017-11-28 NOTE — Therapy (Signed)
[facility] [address] [place], [place], [address] Phone: [phone]   Fax:  [phone]  Occupational Therapy Treatment  Patient Details  Name: Shawna Banks MRN: [id number] Date of Birth: June 28, 1945 Referring Provider: Duwayne Heck, MD   Encounter Date: 11/28/2017  OT End of Session - 11/28/17 1524    Visit Number  3    Number of Visits  5    Date for OT Re-Evaluation  12/12/17    Authorization Type  [employer] Medicare; $40 copay    Authorization Time Period  visits based on medical neccesity    OT Start Time  1432    OT Stop Time  1515    OT Time Calculation (min)  43 min    Activity Tolerance  Patient tolerated treatment well    Behavior During Therapy  [facility] for tasks assessed/performed       Past Medical History:  Diagnosis Date  . Dementia   . 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 Hypercholesteremia   . Hypertension   . Stroke [facility])     History reviewed. No pertinent surgical history.  There were no vitals filed for this visit.  Subjective Assessment - 11/28/17 1432    Subjective   S: I can lay on it now.     Currently in Pain?  No/denies         [facility] OT Assessment - 11/28/17 1432      Assessment   Medical Diagnosis  right humeral fracture      Precautions   Precautions  Shoulder    Precaution Comments  progress as tolerated; no lifting over 2#      Observation/Other Assessments   Focus on Therapeutic Outcomes (FOTO)   51/100               OT Treatments/Exercises (OP) - 11/28/17 1434      Exercises   Exercises  Shoulder      Shoulder Exercises: Supine   Protraction  PROM;5 reps;AAROM;10 reps    Horizontal ABduction  PROM;5 reps;AAROM;10 reps    External Rotation  PROM;5 reps;AAROM;10 reps    Internal Rotation  PROM;5 reps;AAROM;10 reps    Flexion  PROM;5 reps;AAROM;10 reps    ABduction  PROM;5 reps;AAROM;10 reps      Shoulder Exercises:  Standing   Protraction  AAROM;10 reps    Horizontal ABduction  AAROM;10 reps    External Rotation  AAROM;10 reps    Internal Rotation  AAROM;10 reps    Flexion  AAROM;10 reps    ABduction  AAROM;10 reps    Other Standing Exercises  PVC pipe slide-flexion, 10X      Shoulder Exercises: Therapy Ball   Right/Left  --   3 reps each direction     Shoulder Exercises: ROM/Strengthening   Wall Wash  1'      Manual Therapy   Manual Therapy  Myofascial release    Manual therapy comments  completed separately from therapeutic exercises    Myofascial Release  Myofascial release to right upper arm, trapezius, and scapularis regions to decrease pain and fascial restrictions and improve joint range of motion.              OT Education - 11/28/17 1523    Education Details  AA/ROM    Person(s) Educated  Patient;Child(ren)    Methods  Explanation;Demonstration;Handout    Comprehension  Verbalized understanding;Returned demonstration  OT Short Term Goals - 11/18/17 1735      OT SHORT TERM GOAL #1   Title  Patient will be educated on HEP to increase functional performance with daily tasks and participation of right nondominant extremity.    Time  4    Period  Weeks    Status  On-going      OT SHORT TERM GOAL #2   Title  Patient will increase RUE A/ROM to [facility] to increase use of right nondominant arm with dressing tasks.    Time  4    Period  Weeks    Status  On-going      OT SHORT TERM GOAL #3   Title  Patient will increase RUE strength to a 4/5 in order to be able to complete all household tasks with less difficulty.    Time  4    Period  Weeks    Status  On-going      OT SHORT TERM GOAL #4   Title  Patient will decrease fascial restrictions to a min amount or less in order to increase ROM and decrease pain to improve functional performance with daily tasks.    Time  4    Period  Weeks    Status  On-going      OT SHORT TERM GOAL #5   Title  Patient will increase P/ROM  to [facility] in order to increase participation of nondominant left arm in functional activities.    Time  4    Period  Weeks    Status  On-going               Plan - 11/28/17 1524    Clinical Impression Statement  A: Continued with manual therapy to address fascial restrictions in right upper arm and trapezius regions limiting ROM. Pt able to tolerate slight increase in ROM during passive stretching and AA/ROM exercises, OT providing tactile guidance for form during exercises. Added pvc pipe slide, also added therapy ball exercises to prepare for IR behind back. Verbal cuing for form during session.     Plan  P: Follow up on HEP, continue with pvc pipe slide and resume pulleys       Patient will benefit from skilled therapeutic intervention in order to improve the following deficits and impairments:  Pain, Increased fascial restrictions, Decreased activity tolerance, Decreased endurance, Decreased range of motion, Decreased strength, Impaired UE functional use  Visit Diagnosis: Acute pain of right shoulder  Stiffness of right shoulder, not elsewhere classified  Unspecified symptoms and signs involving the musculoskeletal system    Problem List Patient Active Problem List   Diagnosis Date Noted  . 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 Dementia 03/15/2015  . Thrombocytopenia (HCC) 03/15/2015  . Hypokalemia 03/14/2015  . Seizures (HCC)   . Cerebral amyloid angiopathy (HCC)   . HLD (hyperlipidemia)   . Essential hypertension   . 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 Proximal humerus fracture 02/28/2014   Ezra SitesLeslie Troxler, OTR/L  [phone] 11/28/2017, 3:26 PM  [facility] [address] [place], [place], [address] Phone: [phone]   Fax:  [phone]  Name: Shawna FlockBarbara C Banks MRN: [id number] Date of Birth: Oct 12, 1945

## 2017-11-28 NOTE — Patient Instructions (Signed)
Perform each exercise ____10-15____ reps. 1-2x day.   Protraction   Start by holding a wand or cane at chest height.  Next, slowly push the wand outwards in front of your body so that your elbows become fully straightened. Then, return to the original position.     Shoulder FLEXION  In the standing position, hold a wand/cane with both arms, palms down on both sides. Raise up the wand/cane allowing your unaffected arm to perform most of the effort. Your affected arm should be partially relaxed.      Internal/External ROTATION   In the standing position, hold a wand/cane with both hands keeping your elbows bent. Move your arms and wand/cane to one side.  Your affected arm should be partially relaxed while your unaffected arm performs most of the effort.       Shoulder ABDUCTION   While holding a wand/cane palm face up on the injured side and palm face down on the uninjured side, slowly raise up your injured arm to the side.                     Horizontal Abduction/Adduction      Straight arms holding cane at shoulder height, bring cane to right, center, left. Repeat starting to left.   Copyright  VHI. All rights reserved.

## 2017-12-02 ENCOUNTER — Telehealth (HOSPITAL_COMMUNITY): Payer: Self-pay | Admitting: Family Medicine

## 2017-12-02 NOTE — Telephone Encounter (Signed)
12/02/17  daughter called to reschedule - said they couldn't come on 8/23

## 2017-12-03 ENCOUNTER — Encounter (HOSPITAL_COMMUNITY): Payer: Self-pay | Admitting: Occupational Therapy

## 2017-12-03 ENCOUNTER — Ambulatory Visit (HOSPITAL_COMMUNITY): Payer: Medicare Other | Admitting: Occupational Therapy

## 2017-12-03 DIAGNOSIS — M25511 Pain in right shoulder: Secondary | ICD-10-CM | POA: Diagnosis not present

## 2017-12-03 DIAGNOSIS — R2991 Unspecified symptoms and signs involving the musculoskeletal system: Secondary | ICD-10-CM

## 2017-12-03 DIAGNOSIS — M25611 Stiffness of right shoulder, not elsewhere classified: Secondary | ICD-10-CM

## 2017-12-03 NOTE — Therapy (Signed)
[facility] [address] [place], [place], [address] Phone: [phone]   Fax:  [phone]  Occupational Therapy Treatment  Patient Details  Name: Shawna FlockBarbara C Boorman MRN: [id number] Date of Birth: 12-02-1945 Referring Provider: Duwayne HeckJason Rogers, MD   Encounter Date: 12/03/2017  OT End of Session - 12/03/17 2134    Visit Number  4    Number of Visits  5    Date for OT Re-Evaluation  12/12/17    Authorization Type  [employer]HC Medicare; $40 copay    Authorization Time Period  visits based on medical neccesity    OT Start Time  1646    OT Stop Time  1730    OT Time Calculation (min)  44 min    Activity Tolerance  Patient tolerated treatment well    Behavior During Therapy  [facility] for tasks assessed/performed       Past Medical History:  Diagnosis Date  . Dementia   . 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 Hypercholesteremia   . Hypertension   . Stroke [facility](HCC)     History reviewed. No pertinent surgical history.  There were no vitals filed for this visit.  Subjective Assessment - 12/03/17 1646    Subjective   S: I think it's some better.     Currently in Pain?  No/denies         [facility] Assessment - 12/03/17 1645      Assessment   Medical Diagnosis  right humeral fracture      Precautions   Precautions  Shoulder    Precaution Comments  progress as tolerated; no lifting over 2#               OT Treatments/Exercises (OP) - 12/03/17 1648      Exercises   Exercises  Shoulder      Shoulder Exercises: Supine   Protraction  PROM;5 reps;AAROM;10 reps    Horizontal ABduction  PROM;5 reps;AAROM;10 reps    External Rotation  PROM;5 reps;AAROM;10 reps    Internal Rotation  PROM;5 reps;AAROM;10 reps    Flexion  PROM;5 reps;AAROM;10 reps    ABduction  PROM;5 reps;AAROM;10 reps      Shoulder Exercises: Standing   Protraction  AAROM;10 reps    Horizontal ABduction  AAROM;10 reps     External Rotation  AAROM;10 reps    Internal Rotation  AAROM;10 reps    Flexion  AAROM;10 reps    ABduction  AAROM;10 reps    Other Standing Exercises  PVC pipe slide-flexion, 10X      Shoulder Exercises: Pulleys   Flexion  1 minute    ABduction  1 minute      Shoulder Exercises: Therapy Ball   Right/Left  5 reps   each direction     Shoulder Exercises: ROM/Strengthening   Wall Wash  1'      Manual Therapy   Manual Therapy  Myofascial release    Manual therapy comments  completed separately from therapeutic exercises    Myofascial Release  Myofascial release to right upper arm, trapezius, and scapularis regions to decrease pain and fascial restrictions and improve joint range of motion.                OT Short Term Goals - 11/18/17 1735      OT SHORT TERM GOAL #1   Title  Patient will be educated on HEP to increase functional performance with daily tasks  and participation of right nondominant extremity.    Time  4    Period  Weeks    Status  On-going      OT SHORT TERM GOAL #2   Title  Patient will increase RUE A/ROM to [facility] to increase use of right nondominant arm with dressing tasks.    Time  4    Period  Weeks    Status  On-going      OT SHORT TERM GOAL #3   Title  Patient will increase RUE strength to a 4/5 in order to be able to complete all household tasks with less difficulty.    Time  4    Period  Weeks    Status  On-going      OT SHORT TERM GOAL #4   Title  Patient will decrease fascial restrictions to a min amount or less in order to increase ROM and decrease pain to improve functional performance with daily tasks.    Time  4    Period  Weeks    Status  On-going      OT SHORT TERM GOAL #5   Title  Patient will increase P/ROM to [facility] in order to increase participation of nondominant left arm in functional activities.    Time  4    Period  Weeks    Status  On-going               Plan - 12/03/17 2135    Clinical Impression Statement   A: Manual therapy completed to address fascial restrictions in right upper arm and trapezius limiting ROM and causing pain. Pt able to tolerate ROM to 60-70% for flexion and 75% for abduction during passive stretching and AA/ROM today. Continued with PVC pipe slide with cuing for turning at end range. Also continued with therapy ball working on IR behind back. Resumed pulleys, pt able to achieve greater range today, tactile cuing for keeping arms straight during exercise. Verbal and tactile cuing for form and technique throughout session.     Plan  P: Reassess and discuss progress with family-recommend additonal visits. Continue working to improve tolerance during passive stretching to improve range, add scapular theraband for extension and row       Patient will benefit from skilled therapeutic intervention in order to improve the following deficits and impairments:  Pain, Increased fascial restrictions, Decreased activity tolerance, Decreased endurance, Decreased range of motion, Decreased strength, Impaired UE functional use  Visit Diagnosis: Acute pain of right shoulder  Stiffness of right shoulder, not elsewhere classified  Unspecified symptoms and signs involving the musculoskeletal system    Problem List Patient Active Problem List   Diagnosis Date Noted  . 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 Dementia 03/15/2015  . Thrombocytopenia (HCC) 03/15/2015  . Hypokalemia 03/14/2015  . Seizures (HCC)   . Cerebral amyloid angiopathy (HCC)   . HLD (hyperlipidemia)   . Essential hypertension   . 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 Proximal humerus fracture 02/28/2014   Ezra SitesLeslie Jahnasia Tatum, OTR/L  [phone] 12/03/2017, 9:40 PM  [facility] [address] [place], [place], [address] Phone: [phone]   Fax:  [phone]  Name: Shawna FlockBarbara  C Kindig MRN: [id number] Date of Birth: [birth year]

## 2017-12-09 ENCOUNTER — Encounter (HOSPITAL_COMMUNITY): Payer: Self-pay | Admitting: Occupational Therapy

## 2017-12-09 ENCOUNTER — Ambulatory Visit (HOSPITAL_COMMUNITY): Payer: Medicare Other | Admitting: Occupational Therapy

## 2017-12-09 DIAGNOSIS — M25511 Pain in right shoulder: Secondary | ICD-10-CM | POA: Diagnosis not present

## 2017-12-09 DIAGNOSIS — M25611 Stiffness of right shoulder, not elsewhere classified: Secondary | ICD-10-CM

## 2017-12-09 DIAGNOSIS — R2991 Unspecified symptoms and signs involving the musculoskeletal system: Secondary | ICD-10-CM

## 2017-12-10 NOTE — Therapy (Addendum)
[facility] [address] [place], [place], [address] Phone: [phone]   Fax:  [phone]  Occupational Therapy Reassessment, Treatment, Discharge Summary  Patient Details  Name: Shawna Banks MRN: [id number] Date of Birth: December 27, 1945 Referring Provider: Victorino December, MD   Encounter Date: 12/09/2017  OT End of Session - 12/10/17 1415    Visit Number  5    Number of Visits  5    Date for OT Re-Evaluation  12/12/17    Authorization Type  [employer] Medicare; $40 copay    Authorization Time Period  visits based on medical neccesity    OT Start Time  1647    OT Stop Time  1726    OT Time Calculation (min)  39 min    Activity Tolerance  Patient tolerated treatment well    Behavior During Therapy  [facility] for tasks assessed/performed       Past Medical History:  Diagnosis Date  . Dementia   . 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 Hypercholesteremia   . Hypertension   . Stroke [facility])     History reviewed. No pertinent surgical history.  There were no vitals filed for this visit.  Subjective Assessment - 12/09/17 1647    Subjective   S: I'm left handed so I don't use this one much.     Currently in Pain?  No/denies         [facility] OT Assessment - 12/09/17 1646      Assessment   Medical Diagnosis  right humeral fracture      Precautions   Precautions  Shoulder    Precaution Comments  progress as tolerated; no lifting over 2#      Palpation   Palpation comment  Moderate fascial restrictions palpated in upper arm, trapezius, and upper trapezius regions.      AROM   Overall AROM Comments  assessed seated; IR/er adducted    AROM Assessment Site  Shoulder    Right/Left Shoulder  Right    Right Shoulder Flexion  115 Degrees   79 previous   Right Shoulder ABduction  100 Degrees   69 previous   Right Shoulder Internal Rotation  90 Degrees   same as previous   Right Shoulder  External Rotation  46 Degrees   47 previous     PROM   Overall PROM Comments  assessed supine; IR/er adducted    PROM Assessment Site  Shoulder    Right/Left Shoulder  Right    Right Shoulder Flexion  126 Degrees   85 previous   Right Shoulder ABduction  130 Degrees   70 previous   Right Shoulder Internal Rotation  90 Degrees   same as previous   Right Shoulder External Rotation  65 Degrees   42 previous     Strength   Strength Assessment Site  Shoulder    Right/Left Shoulder  Right    Right Shoulder Flexion  3/5   3-/5 previous   Right Shoulder ABduction  3/5   3-/5 previous   Right Shoulder Internal Rotation  4-/5   3-/5 previous   Right Shoulder External Rotation  3/5   3-/5 previous              OT Treatments/Exercises (OP) - 12/09/17 1650      Exercises   Exercises  Shoulder      Shoulder Exercises: Supine   Protraction  PROM;5 reps;AAROM;10 reps  Horizontal ABduction  PROM;5 reps;AAROM;10 reps    External Rotation  PROM;5 reps;AROM;10 reps    Internal Rotation  PROM;5 reps;AROM;10 reps    Flexion  PROM;5 reps;AAROM;10 reps    ABduction  PROM;5 reps;AAROM;10 reps      Shoulder Exercises: Standing   Protraction  AAROM;10 reps    Horizontal ABduction  AAROM;10 reps    External Rotation  AAROM;10 reps    Internal Rotation  AAROM;10 reps    Flexion  AAROM;10 reps    ABduction  AAROM;10 reps      Shoulder Exercises: Pulleys   Flexion  1 minute    ABduction  1 minute      Manual Therapy   Manual Therapy  Myofascial release    Manual therapy comments  completed separately from therapeutic exercises    Myofascial Release  Myofascial release to right upper arm, trapezius, and scapularis regions to decrease pain and fascial restrictions and improve joint range of motion.                OT Short Term Goals - 12/10/17 1415      OT SHORT TERM GOAL #1   Title  Patient will be educated on HEP to increase functional performance with daily tasks  and participation of right nondominant extremity.    Time  4    Period  Weeks    Status  Achieved      OT SHORT TERM GOAL #2   Title  Patient will increase RUE A/ROM to [facility] to increase use of right nondominant arm with dressing tasks.    Time  4    Period  Weeks    Status  Partially Met      OT SHORT TERM GOAL #3   Title  Patient will increase RUE strength to a 4/5 in order to be able to complete all household tasks with less difficulty.    Time  4    Period  Weeks    Status  On-going      OT SHORT TERM GOAL #4   Title  Patient will decrease fascial restrictions to a min amount or less in order to increase ROM and decrease pain to improve functional performance with daily tasks.    Time  4    Period  Weeks    Status  On-going      OT SHORT TERM GOAL #5   Title  Patient will increase P/ROM to [facility] in order to increase participation of nondominant left arm in functional activities.    Time  4    Period  Weeks    Status  Partially Met               Plan - 12/10/17 1417    Clinical Impression Statement  A: Reassessment completed this session, pt has met 1 STG and has partially met 2 additional STGs. Pt has made good progress towards improved ROM and strength, continues to be limited due to pain, stiffness, and weakness of the RUE. Pt and daughter report she is completing her HEP daily. Continued with AA/ROM today, discussed progress with pt and daughter. Daughter will speak with family to determine if they want to continue therapy or discharge.     Plan  P: Hold therapy until daughter calls to let us know if she will continue or discharge.        Patient will benefit from skilled therapeutic intervention in order to improve the following deficits and impairments:  Pain,  Increased fascial restrictions, Decreased activity tolerance, Decreased endurance, Decreased range of motion, Decreased strength, Impaired UE functional use  Visit Diagnosis: Acute pain of right  shoulder  Stiffness of right shoulder, not elsewhere classified  Unspecified symptoms and signs involving the musculoskeletal system    Problem List Patient Active Problem List   Diagnosis Date Noted  . 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 Dementia 03/15/2015  . Thrombocytopenia ([place]) 03/15/2015  . Hypokalemia 03/14/2015  . Seizures ([place])   . 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 HLD (hyperlipidemia)   . Essential hypertension   . 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 Proximal humerus fracture 02/28/2014   Guadelupe Sabin, OTR/L  [phone] 12/10/2017, 2:19 PM  [place] [address] [place], [place], [address] Phone: [phone]   Fax:  [phone]  Name: ADDISEN CHAPPELLE MRN: [id number] Date of Birth: [birth year]   Addendum: 01/21/2018 OCCUPATIONAL THERAPY DISCHARGE SUMMARY  Visits from Start of Care: 5  Current functional level related to goals / functional outcomes: See above. Pt/family did not call to schedule additional appointments since last visit on 12/10/2017.    Remaining deficits: ROM and strength limitations   Education / Equipment: HEP for ROM Plan: Patient agrees to discharge.  Patient goals were partially met. Patient is being discharged due to not returning since the last visit.  ?????

## 2017-12-12 ENCOUNTER — Encounter (HOSPITAL_COMMUNITY): Payer: Medicare Other

## 2017-12-23 ENCOUNTER — Other Ambulatory Visit (HOSPITAL_COMMUNITY)
Admission: RE | Admit: 2017-12-23 | Discharge: 2017-12-23 | Disposition: A | Discharge status: Home / Self Care | Payer: Medicare Other | Source: Ambulatory Visit | Attending: Nurse Practitioner | Admitting: Nurse Practitioner

## 2017-12-23 DIAGNOSIS — E876 Hypokalemia: Secondary | ICD-10-CM | POA: Diagnosis present

## 2017-12-23 DIAGNOSIS — N390 Urinary tract infection, site not specified: Secondary | ICD-10-CM | POA: Insufficient documentation

## 2017-12-23 DIAGNOSIS — R5383 Other fatigue: Secondary | ICD-10-CM | POA: Diagnosis present

## 2017-12-23 DIAGNOSIS — R410 Disorientation, unspecified: Secondary | ICD-10-CM | POA: Insufficient documentation

## 2017-12-23 LAB — COMPREHENSIVE METABOLIC PANEL
ALBUMIN: 4.2 g/dL (ref 3.5–5.0)
ALT: 9 U/L (ref 0–44)
AST: 18 U/L (ref 15–41)
Alkaline Phosphatase: 111 U/L (ref 38–126)
Anion gap: 9 (ref 5–15)
BUN: 8 mg/dL (ref 8–23)
CHLORIDE: 100 mmol/L (ref 98–111)
CO2: 29 mmol/L (ref 22–32)
CREATININE: 0.69 mg/dL (ref 0.44–1.00)
Calcium: 8.8 mg/dL — ABNORMAL LOW (ref 8.9–10.3)
GFR calc Af Amer: >60 mL/min (ref >60)
GLUCOSE: 97 mg/dL (ref 70–99)
POTASSIUM: 3.1 mmol/L — AB (ref 3.5–5.1)
Sodium: 138 mmol/L (ref 135–145)
Total Bilirubin: 0.8 mg/dL (ref 0.3–1.2)
Total Protein: 7.8 g/dL (ref 6.5–8.1)

## 2017-12-23 LAB — CBC WITH DIFFERENTIAL/PLATELET
BASOS ABS: 0 10*3/uL (ref 0.0–0.1)
BASOS PCT: 0 %
EOS ABS: 0.1 10*3/uL (ref 0.0–0.7)
Eosinophils Relative: 1 %
HEMATOCRIT: 41.9 % (ref 36.0–46.0)
Hemoglobin: 13.7 g/dL (ref 12.0–15.0)
LYMPHS PCT: 13 %
Lymphs Abs: 1.1 10*3/uL (ref 0.7–4.0)
MCH: 31.1 pg (ref 26.0–34.0)
MCHC: 32.7 g/dL (ref 30.0–36.0)
MCV: 95 fL (ref 78.0–100.0)
Monocytes Absolute: 0.5 10*3/uL (ref 0.1–1.0)
Monocytes Relative: 6 %
Neutro Abs: 6.6 10*3/uL (ref 1.7–7.7)
Neutrophils Relative %: 80 %
Platelets: 186 10*3/uL (ref 150–400)
RBC: 4.41 MIL/uL (ref 3.87–5.11)
RDW: 14.8 % (ref 11.5–15.5)
WBC: 8.4 10*3/uL (ref 4.0–10.5)

## 2018-03-19 ENCOUNTER — Encounter (HOSPITAL_COMMUNITY): Payer: Self-pay | Admitting: Emergency Medicine

## 2018-03-19 ENCOUNTER — Emergency Department (HOSPITAL_COMMUNITY): Payer: Medicare Other

## 2018-03-19 ENCOUNTER — Emergency Department (HOSPITAL_COMMUNITY)
Admission: EM | Admit: 2018-03-19 | Discharge: 2018-03-19 | Disposition: A | Discharge status: Home / Self Care | Payer: Medicare Other | Attending: Emergency Medicine | Admitting: Emergency Medicine

## 2018-03-19 ENCOUNTER — Other Ambulatory Visit: Payer: Self-pay

## 2018-03-19 DIAGNOSIS — Z79899 Other long term (current) drug therapy: Secondary | ICD-10-CM | POA: Diagnosis not present

## 2018-03-19 DIAGNOSIS — R569 Unspecified convulsions: Secondary | ICD-10-CM | POA: Diagnosis not present

## 2018-03-19 DIAGNOSIS — I10 Essential (primary) hypertension: Secondary | ICD-10-CM | POA: Insufficient documentation

## 2018-03-19 DIAGNOSIS — I69359 Hemiplegia and hemiparesis following cerebral infarction affecting unspecified side: Secondary | ICD-10-CM | POA: Insufficient documentation

## 2018-03-19 DIAGNOSIS — I69328 Other speech and language deficits following cerebral infarction: Secondary | ICD-10-CM | POA: Diagnosis not present

## 2018-03-19 DIAGNOSIS — F0150 Vascular dementia without behavioral disturbance: Secondary | ICD-10-CM | POA: Insufficient documentation

## 2018-03-19 DIAGNOSIS — R4182 Altered mental status, unspecified: Secondary | ICD-10-CM | POA: Diagnosis present

## 2018-03-19 LAB — URINALYSIS, ROUTINE W REFLEX MICROSCOPIC
Bilirubin Urine: NEGATIVE
Glucose, UA: NEGATIVE mg/dL
Hgb urine dipstick: NEGATIVE
Ketones, ur: NEGATIVE mg/dL
Leukocytes, UA: NEGATIVE
Nitrite: NEGATIVE
Protein, ur: NEGATIVE mg/dL
Specific Gravity, Urine: 1.01 (ref 1.005–1.030)
pH: 6 (ref 5.0–8.0)

## 2018-03-19 LAB — COMPREHENSIVE METABOLIC PANEL
ALT: 8 U/L (ref 0–44)
AST: 14 U/L — ABNORMAL LOW (ref 15–41)
Albumin: 3.8 g/dL (ref 3.5–5.0)
Alkaline Phosphatase: 107 U/L (ref 38–126)
Anion gap: 8 (ref 5–15)
BUN: 6 mg/dL — ABNORMAL LOW (ref 8–23)
CO2: 25 mmol/L (ref 22–32)
Calcium: 8.3 mg/dL — ABNORMAL LOW (ref 8.9–10.3)
Chloride: 106 mmol/L (ref 98–111)
Creatinine, Ser: 0.57 mg/dL (ref 0.44–1.00)
GFR calc Af Amer: >60 mL/min (ref >60)
GFR calc non Af Amer: >60 mL/min (ref >60)
Glucose, Bld: 98 mg/dL (ref 70–99)
Potassium: 2.9 mmol/L — ABNORMAL LOW (ref 3.5–5.1)
Sodium: 139 mmol/L (ref 135–145)
Total Bilirubin: 0.5 mg/dL (ref 0.3–1.2)
Total Protein: 6.7 g/dL (ref 6.5–8.1)

## 2018-03-19 LAB — CBC WITH DIFFERENTIAL/PLATELET
Abs Immature Granulocytes: 0.02 10*3/uL (ref 0.00–0.07)
BASOS PCT: 0 %
Basophils Absolute: 0 10*3/uL (ref 0.0–0.1)
Eosinophils Absolute: 0 10*3/uL (ref 0.0–0.5)
Eosinophils Relative: 0 %
HCT: 37.2 % (ref 36.0–46.0)
Hemoglobin: 11.7 g/dL — ABNORMAL LOW (ref 12.0–15.0)
IMMATURE GRANULOCYTES: 0 %
Lymphocytes Relative: 13 %
Lymphs Abs: 0.7 10*3/uL (ref 0.7–4.0)
MCH: 29.3 pg (ref 26.0–34.0)
MCHC: 31.5 g/dL (ref 30.0–36.0)
MCV: 93 fL (ref 80.0–100.0)
Monocytes Absolute: 0.3 10*3/uL (ref 0.1–1.0)
Monocytes Relative: 6 %
Neutro Abs: 4 10*3/uL (ref 1.7–7.7)
Neutrophils Relative %: 81 %
PLATELETS: 147 10*3/uL — AB (ref 150–400)
RBC: 4 MIL/uL (ref 3.87–5.11)
RDW: 13.7 % (ref 11.5–15.5)
WBC: 5.1 10*3/uL (ref 4.0–10.5)
nRBC: 0 % (ref 0.0–0.2)

## 2018-03-19 MED ORDER — POTASSIUM CHLORIDE 10 MEQ/100ML IV SOLN
10.0000 meq | Freq: Once | INTRAVENOUS | Status: AC
  Administered 2018-03-19: 10 meq via INTRAVENOUS
  Filled 2018-03-19: qty 100

## 2018-03-19 MED ORDER — SODIUM CHLORIDE 0.9 % IV SOLN
INTRAVENOUS | Status: DC | PRN
  Administered 2018-03-19: 125 mL via INTRAVENOUS

## 2018-03-19 MED ORDER — POTASSIUM CHLORIDE CRYS ER 20 MEQ PO TBCR
40.0000 meq | EXTENDED_RELEASE_TABLET | Freq: Once | ORAL | Status: AC
  Administered 2018-03-19: 40 meq via ORAL
  Filled 2018-03-19: qty 2

## 2018-03-19 NOTE — ED Provider Notes (Signed)
[facility] EMERGENCY DEPARTMENT Provider Note   CSN: [id number] Arrival date & time: 03/19/18  1107     History   Chief Complaint Chief Complaint  Patient presents with  . Altered Mental Status    HPI Shawna Banks is a 72 y.o. female.  Patient has a history of dementia.  According to her daughter her dementia is gotten somewhat worse over the last couple days but she is better than yesterday.  They were concerned that she might have another urinary tract infection  The history is provided by the patient and a relative.  Altered Mental Status   This is a chronic problem. The current episode started more than 1 week ago. The problem has not changed since onset.Associated symptoms include confusion. Pertinent negatives include no seizures and no hallucinations. Risk factors: Dementia. Her past medical history does not include seizures.    Past Medical History:  Diagnosis Date  . Dementia (HCC)   . 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 Hypercholesteremia   . Hypertension   . Stroke [facility](HCC)     Patient Active Problem List   Diagnosis Date Noted  . 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 Dementia (HCC) 03/15/2015  . Thrombocytopenia (HCC) 03/15/2015  . Hypokalemia 03/14/2015  . Seizures (HCC)   . Cerebral amyloid angiopathy (HCC)   . HLD (hyperlipidemia)   . Essential hypertension   . 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 Proximal humerus fracture 02/28/2014    History reviewed. No pertinent surgical history.   OB History   None      Home Medications    Prior to Admission medications   Medication Sig Start Date End Date Taking? Authorizing Provider  enalapril (VASOTEC) 20 MG tablet Take 20 mg by mouth daily.   Yes [provider]  hydrochlorothiazide (HYDRODIURIL) 25 MG tablet Take 25 mg by  mouth daily as needed (for high blood pressure levels).   Yes [provider]  levETIRAcetam (KEPPRA) 750 MG tablet Take 750 mg by mouth 2 (two) times daily.   Yes [provider]  NAMZARIC 28-10 MG CP24 Take 1 capsule by mouth daily. 03/08/18  Yes [provider]  simvastatin (ZOCOR) 40 MG tablet Take 40 mg by mouth at bedtime.    Yes [provider]  vitamin B-12 (CYANOCOBALAMIN) 1000 MCG tablet Take 1,000 mcg by mouth 2 (two) times daily.   Yes [provider]  Vitamin D, Ergocalciferol, (DRISDOL) 50000 UNITS CAPS capsule Take 50,000 Units by mouth every 30 (thirty) days. Every other Wednesday   Yes [provider]    Family History Family History  Problem Relation Age of Onset  . Hypertension Mother   . Heart attack Mother   . Lung cancer Sister   . Hypertension Father   . Cancer Sister     Social History Social History   Tobacco Use  . Smoking status: Never Smoker  . Smokeless tobacco: Never Used  Substance Use Topics  . Alcohol use: No  . Drug use: No     Allergies   Patient has no known allergies.   Review of Systems Review of Systems  Constitutional: Negative for appetite change and fatigue.  HENT: Negative for congestion, ear discharge and sinus pressure.   Eyes: Negative for discharge.  Respiratory: Negative for cough.   Cardiovascular: Negative for chest pain.  Gastrointestinal: Negative for abdominal pain and diarrhea.  Genitourinary: Negative for frequency and hematuria.  Musculoskeletal: Negative for back pain.  Skin: Negative for rash.  Neurological: Negative for seizures and headaches.  Psychiatric/Behavioral: Positive for confusion. Negative for hallucinations.     Physical Exam Updated Vital Signs BP (!) 150/80   Pulse 64   Temp 98.2 F (36.8 C) (Oral)   Resp 14   Ht 5\' 2"  (1.575 m)   Wt 60.2 kg   SpO2 96%   BMI 24.29 kg/m   Physical Exam  Constitutional: She appears well-developed.   HENT:  Head: Normocephalic.  Eyes: Conjunctivae and EOM are normal. No scleral icterus.  Neck: Neck supple. No thyromegaly present.  Cardiovascular: Normal rate and regular rhythm. Exam reveals no gallop and no friction rub.  No murmur heard. Pulmonary/Chest: No stridor. She has no wheezes. She has no rales. She exhibits no tenderness.  Abdominal: She exhibits no distension. There is no tenderness. There is no rebound.  Musculoskeletal: Normal range of motion. She exhibits no edema.  Lymphadenopathy:    She has no cervical adenopathy.  Neurological: She is alert. She exhibits normal muscle tone. Coordination normal.  She is oriented to person place only  Skin: No rash noted. No erythema.  Psychiatric: She has a normal mood and affect. Her behavior is normal.     ED Treatments / Results  Labs (all labs ordered are listed, but only abnormal results are displayed) Labs Reviewed  CBC WITH DIFFERENTIAL/PLATELET - Abnormal; Notable for the following components:      Result Value   Hemoglobin 11.7 (*)    Platelets 147 (*)    All other components within normal limits  COMPREHENSIVE METABOLIC PANEL - Abnormal; Notable for the following components:   Potassium 2.9 (*)    BUN 6 (*)    Calcium 8.3 (*)    AST 14 (*)    All other components within normal limits  URINE CULTURE  URINALYSIS, ROUTINE W REFLEX MICROSCOPIC    EKG EKG Interpretation  Date/Time:  Thursday March 19 2018 11:21:27 EST Ventricular Rate:  70 PR Interval:    QRS Duration: 104 QT Interval:  435 QTC Calculation: 470 R Axis:   31 Text Interpretation:  Sinus rhythm RSR' in V1 or V2, right VCD or RVH Baseline wander in lead(s) V3 V6 Confirmed by Bethann Berkshire [phone]) on 03/19/2018 2:12:40 PM   Radiology Ct Head Wo Contrast  Result Date: 03/19/2018 CLINICAL DATA:  72 year old female with increased confusion. EXAM: CT HEAD WITHOUT CONTRAST TECHNIQUE: Contiguous axial images were obtained from the base of the  skull through the vertex without intravenous contrast. COMPARISON:  Head and cervical spine CT 10/31/2017. FINDINGS: Brain: Stable cerebral volume. Chronic left hemisphere encephalomalacia at the junction of the left temporal and occipital lobes. Patchy cerebral white matter hypodensity elsewhere appears stable. No midline shift, ventriculomegaly, mass effect, evidence of mass lesion, intracranial hemorrhage or evidence of cortically based acute infarction. Vascular: Calcified atherosclerosis at the skull base. No suspicious intracranial vascular hyperdensity. Skull: Osteopenia. Stable and intact. Sinuses/Orbits: Visualized paranasal sinuses and mastoids are stable and generally well pneumatized. Other: Visualized orbits and scalp soft tissues are within normal limits. IMPRESSION: 1. No acute intracranial abnormality. 2. Stable non contrast CT appearance of the brain with chronic left hemisphere encephalomalacia. Electronically Signed   By: Odessa Fleming M.D.   On: 03/19/2018 12:01    Procedures Procedures (including critical care time)  Medications Ordered in ED Medications  0.9 %  sodium chloride infusion ( Intravenous Stopped 03/19/18 1421)  potassium chloride SA (K-DUR,KLOR-CON) CR tablet 40 mEq (40 mEq Oral Given 03/19/18 1320)  potassium chloride 10 mEq in 100 mL IVPB (0 mEq Intravenous Stopped 03/19/18 1413)     Initial Impression / Assessment and Plan / ED Course  I have reviewed the triage vital signs and the nursing notes.  Pertinent labs & imaging results that were available during my care of the patient were reviewed by me and considered in my medical decision making (see chart for details).     Labs unremarkable except for hypokalemia which was replaced.  CT scan shows the vascular disease causing dementia.  She had a urine culture done.  I suspect this is just waxing and waning of her dementia.  She will follow-up with her PCP next week for recheck and to see how the urine culture  shows  Final Clinical Impressions(s) / ED Diagnoses   Final diagnoses:  Vascular dementia without behavioral disturbance [facility])    ED Discharge Orders    None       Bethann Berkshire, MD 03/19/18 1430

## 2018-03-19 NOTE — ED Triage Notes (Signed)
Pt's family states that pt was more confused than normal.  Pt has hx of dementia.  Pt's family states concerned for a UTI.

## 2018-03-19 NOTE — Discharge Instructions (Addendum)
Follow-up with your doctor next week for recheck.  Return sooner if any problems

## 2018-03-19 NOTE — ED Notes (Signed)
Patient given discharge instruction, verbalized understand. IV removed, band aid applied. Patient wheelchair out of the department.  

## 2018-03-21 LAB — URINE CULTURE: Culture: NO GROWTH

## 2018-07-22 ENCOUNTER — Encounter (HOSPITAL_COMMUNITY): Payer: Self-pay | Admitting: Emergency Medicine

## 2018-07-22 ENCOUNTER — Other Ambulatory Visit: Payer: Self-pay

## 2018-07-22 ENCOUNTER — Emergency Department (HOSPITAL_COMMUNITY): Payer: Medicare Other

## 2018-07-22 ENCOUNTER — Emergency Department (HOSPITAL_COMMUNITY)
Admission: EM | Admit: 2018-07-22 | Discharge: 2018-07-22 | Disposition: A | Discharge status: Home / Self Care | Payer: Medicare Other | Attending: Emergency Medicine | Admitting: Emergency Medicine

## 2018-07-22 DIAGNOSIS — R4182 Altered mental status, unspecified: Secondary | ICD-10-CM | POA: Diagnosis present

## 2018-07-22 DIAGNOSIS — E860 Dehydration: Secondary | ICD-10-CM

## 2018-07-22 DIAGNOSIS — F0390 Unspecified dementia without behavioral disturbance: Secondary | ICD-10-CM | POA: Insufficient documentation

## 2018-07-22 DIAGNOSIS — I10 Essential (primary) hypertension: Secondary | ICD-10-CM | POA: Diagnosis not present

## 2018-07-22 DIAGNOSIS — Z8673 Personal history of transient ischemic attack (TIA), and cerebral infarction without residual deficits: Secondary | ICD-10-CM | POA: Diagnosis not present

## 2018-07-22 DIAGNOSIS — Z79899 Other long term (current) drug therapy: Secondary | ICD-10-CM | POA: Insufficient documentation

## 2018-07-22 LAB — CBC WITH DIFFERENTIAL/PLATELET
Abs Immature Granulocytes: 0.04 10*3/uL (ref 0.00–0.07)
Basophils Absolute: 0 10*3/uL (ref 0.0–0.1)
Basophils Relative: 1 %
Eosinophils Absolute: 0 10*3/uL (ref 0.0–0.5)
Eosinophils Relative: 0 %
HCT: 48.8 % — ABNORMAL HIGH (ref 36.0–46.0)
Hemoglobin: 15.9 g/dL — ABNORMAL HIGH (ref 12.0–15.0)
Immature Granulocytes: 1 %
Lymphocytes Relative: 10 %
Lymphs Abs: 0.7 10*3/uL (ref 0.7–4.0)
MCH: 30.3 pg (ref 26.0–34.0)
MCHC: 32.6 g/dL (ref 30.0–36.0)
MCV: 93.1 fL (ref 80.0–100.0)
Monocytes Absolute: 0.5 10*3/uL (ref 0.1–1.0)
Monocytes Relative: 6 %
Neutro Abs: 6.1 10*3/uL (ref 1.7–7.7)
Neutrophils Relative %: 82 %
Platelets: 163 10*3/uL (ref 150–400)
RBC: 5.24 MIL/uL — ABNORMAL HIGH (ref 3.87–5.11)
RDW: 15 % (ref 11.5–15.5)
WBC: 7.4 10*3/uL (ref 4.0–10.5)
nRBC: 0 % (ref 0.0–0.2)

## 2018-07-22 LAB — COMPREHENSIVE METABOLIC PANEL
ALT: 16 U/L (ref 0–44)
AST: 33 U/L (ref 15–41)
Albumin: 4.7 g/dL (ref 3.5–5.0)
Alkaline Phosphatase: 167 U/L — ABNORMAL HIGH (ref 38–126)
Anion gap: 12 (ref 5–15)
BUN: 12 mg/dL (ref 8–23)
CO2: 25 mmol/L (ref 22–32)
Calcium: 9.3 mg/dL (ref 8.9–10.3)
Chloride: 97 mmol/L — ABNORMAL LOW (ref 98–111)
Creatinine, Ser: 0.61 mg/dL (ref 0.44–1.00)
GFR calc Af Amer: >60 mL/min (ref >60)
GFR calc non Af Amer: >60 mL/min (ref >60)
Glucose, Bld: 105 mg/dL — ABNORMAL HIGH (ref 70–99)
Potassium: 3.5 mmol/L (ref 3.5–5.1)
Sodium: 134 mmol/L — ABNORMAL LOW (ref 135–145)
Total Bilirubin: 1.2 mg/dL (ref 0.3–1.2)
Total Protein: 8 g/dL (ref 6.5–8.1)

## 2018-07-22 LAB — LACTIC ACID, PLASMA: Lactic Acid, Venous: 1.2 mmol/L (ref 0.5–1.9)

## 2018-07-22 LAB — URINALYSIS, ROUTINE W REFLEX MICROSCOPIC
Glucose, UA: NEGATIVE mg/dL
Hgb urine dipstick: NEGATIVE
Ketones, ur: NEGATIVE mg/dL
Leukocytes,Ua: NEGATIVE
Nitrite: NEGATIVE
Protein, ur: NEGATIVE mg/dL
Specific Gravity, Urine: >1.03 — ABNORMAL HIGH (ref 1.005–1.030)
pH: 5.5 (ref 5.0–8.0)

## 2018-07-22 LAB — PROTIME-INR
INR: 1 (ref 0.8–1.2)
Prothrombin Time: 13 seconds (ref 11.4–15.2)

## 2018-07-22 LAB — TROPONIN I: Troponin I: <0.03 ng/mL (ref <0.03)

## 2018-07-22 MED ORDER — SODIUM CHLORIDE 0.9 % IV BOLUS
1000.0000 mL | Freq: Once | INTRAVENOUS | Status: AC
  Administered 2018-07-22: 1000 mL via INTRAVENOUS

## 2018-07-22 NOTE — ED Notes (Signed)
Patient give a cup of water to drink, able to drink with no problem. Patient walked to restroom.

## 2018-07-22 NOTE — ED Notes (Signed)
Patient very confused and not able to answer questions appropriately.

## 2018-07-22 NOTE — Discharge Instructions (Signed)
Take your usual prescriptions as previously directed. Increase your fluid intake for the next several days. Call your regular medical doctor tomorrow to schedule a follow up appointment this week.  Return to the Emergency Department immediately sooner if worsening.

## 2018-07-22 NOTE — ED Notes (Signed)
Patient given oral fluids.

## 2018-07-22 NOTE — ED Provider Notes (Signed)
[facility] EMERGENCY DEPARTMENT Provider Note   CSN: [id number] Arrival date & time: 07/22/18  1913    History   Chief Complaint Chief Complaint  Patient presents with   Altered Mental Status    HPI Shawna Banks is a 73 y.o. female.     The history is provided by the patient, a relative and a caregiver. The history is limited by the condition of the patient (Hx dementia).  Altered Mental Status  Pt was seen at 1945.  Per pt's family and pt: Family states they "think she's dehydrated" because she "hasn't drank or ate much today." Family unclear if pt "might be" more confused than baseline today. Pt herself denies any complaints. Denies CP/SOB, no abd pain. No reported cough, fevers, vomiting/diarrhea, focal motor weakness.     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 No pertinent surgical history.   OB History   No obstetric history on file.      Home Medications    Prior to Admission medications   Medication Sig Start Date End Date Taking? Authorizing Provider  acetaminophen (TYLENOL) 500 MG tablet Take 500-1,000 mg by mouth every 6 (six) hours as needed for mild pain or moderate pain.   Yes [provider]  ciprofloxacin (CIPRO) 500 MG tablet Take 500 mg by mouth 2 (two) times daily. 15 day  course starting on 07/06/2018 07/06/18  Yes [provider]  [employer] VITAMIN B12 1000 MCG TBCR Take 1 tablet by mouth daily.  04/03/18  Yes [provider]  enalapril (VASOTEC) 20 MG tablet Take 10 mg by mouth daily as needed (for high blood pressure levels).    Yes [provider]  hydrochlorothiazide (HYDRODIURIL) 25 MG tablet Take 25 mg by mouth daily as needed (for high blood pressure levels).   Yes [provider]  levETIRAcetam (KEPPRA) 750 MG tablet Take 750 mg by mouth 2 (two) times daily.   Yes [provider]  NAMZARIC 28-10 MG CP24 Take 1 capsule by mouth every evening.  03/08/18  Yes [provider]  potassium chloride (K-DUR) 10 MEQ tablet Take 10 mEq by mouth daily. 06/24/18  Yes [provider]  simvastatin (ZOCOR) 40 MG tablet Take 40 mg by mouth at bedtime.    Yes [provider]  Vitamin D, Ergocalciferol, (DRISDOL) 50000 UNITS CAPS capsule Take 50,000 Units by mouth every 30 (thirty) days.    Yes [provider]  sulfamethoxazole-trimethoprim (BACTRIM DS,SEPTRA DS) 800-160 MG tablet Take 1 tablet by mouth 2 (two) times daily. 10 day course starting on 07/21/2018--STOPPED and changed to CIPRO 07/03/18   [provider]    Family History Family History  Problem Relation Age of Onset   Hypertension Mother    Heart attack Mother    Lung cancer Sister  Hypertension Father    Cancer Sister     Social History Social History   Tobacco Use   Smoking status: Never Smoker   Smokeless tobacco: Never Used  Substance Use Topics   Alcohol use: No   Drug use: No     Allergies   Patient has no known allergies.   Review of Systems Review of Systems  Unable to perform ROS: Dementia     Physical Exam Updated Vital Signs BP 121/72    Pulse 88    Temp (!) 97.5 F (36.4 C) (Temporal)    Resp 17    Ht 5\' 3"  (1.6 m)    Wt 49.9 kg    SpO2 98%    BMI 19.49 kg/m    Patient Vitals for  the past 24 hrs:  BP Temp Temp src Pulse Resp SpO2 Height Weight  07/22/18 2100 121/72 -- -- 88 17 98 % -- --  07/22/18 1925 -- -- -- -- -- -- 5\' 3"  (1.6 m) 49.9 kg  07/22/18 1922 129/88 (!) 97.5 F (36.4 C) Temporal (!) 109 16 96 % -- --        19:52 Orthostatic Vital Signs AH  Orthostatic Lying   BP- Lying: 127/83  Pulse- Lying: 86      Orthostatic Sitting  BP- Sitting: 103/79  Pulse- Sitting: 103      Orthostatic Standing at 0 minutes  BP- Standing at 0 minutes: 95/80  Pulse- Standing at 0 minutes: 120      Orthostatic Standing at 3 minutes  BP- Standing at 3 minutes: 115/76  Pulse- Standing at 3 minutes: 115    After IVF:  22:28 Orthostatic Vital Signs AH  Orthostatic Lying   BP- Lying: 145/85  Pulse- Lying: 88      Orthostatic Sitting  BP- Sitting: 141/89  Pulse- Sitting: 100      Orthostatic Standing at 0 minutes  BP- Standing at 0 minutes: 134/95Abnormal   Pulse- Standing at 0 minutes: 107    Physical Exam 1950: Physical examination:  Nursing notes reviewed; Vital signs and O2 SAT reviewed;  Constitutional: Well developed, Well nourished, In no acute distress; Head:  Normocephalic, atraumatic; Eyes: EOMI, PERRL, No scleral icterus; ENMT: Mouth and pharynx normal, Mucous membranes dry; Neck: Supple, Full range of motion, No lymphadenopathy; Cardiovascular: Regular rate and rhythm, No gallop; Respiratory: Breath sounds clear & equal bilaterally, No wheezes.  Speaking full sentences with ease, Normal respiratory effort/excursion; Chest: Nontender, Movement normal; Abdomen: Soft, Nontender, Nondistended, Normal bowel sounds; Genitourinary: No CVA tenderness; Extremities: Peripheral pulses normal, No tenderness, No edema, No calf edema or asymmetry.; Neuro: Awake, alert, confused per hx dementia. No facial droop. Speech clear. Grips equal. Strength 5/5 equal bilat UE's and LE's. Pt moves all extremities spontaneously and to command without apparent gross focal  motor deficits.; Skin: Color normal, Warm, Dry.     ED Treatments / Results  Labs (all labs ordered are listed, but only abnormal results are displayed)   EKG EKG Interpretation  Date/Time:  Wednesday July 22 2018 19:39:46 EDT Ventricular Rate:  97 PR Interval:    QRS Duration: 92 QT Interval:  369 QTC Calculation: 469 R Axis:   -3 Text Interpretation:  Sinus rhythm RSR' in V1 or V2, probably normal variant Minimal ST elevation, inferior leads Baseline wander When compared with ECG of 03/19/2018 No significant change was found Confirmed by Samuel Jester [phone]) on 07/22/2018 7:54:13 PM   Radiology   Procedures Procedures (including critical care time)  Medications Ordered in ED Medications  sodium chloride 0.9 % bolus 1,000 mL (0 mLs Intravenous Stopped 07/22/18 2234)     Initial Impression / Assessment and Plan / ED Course  I have reviewed the triage vital signs and the nursing notes.  Pertinent labs & imaging results that were available during my care of the patient were reviewed by me and considered in my medical decision making (see chart for details).    MDM Reviewed: nursing note, previous chart and vitals Reviewed previous: labs and ECG Interpretation: labs, ECG, x-ray and CT scan    Results for orders placed or performed during the hospital encounter of 07/22/18  Comprehensive metabolic panel  Result Value Ref Range   Sodium 134 (L) 135 - 145 mmol/L   Potassium 3.5 3.5 - 5.1 mmol/L   Chloride 97 (L) 98 - 111 mmol/L   CO2 25 22 - 32 mmol/L   Glucose, Bld 105 (H) 70 - 99 mg/dL   BUN 12 8 - 23 mg/dL   Creatinine, Ser 1.61 0.44 - 1.00 mg/dL   Calcium 9.3 8.9 - 09.6 mg/dL   Total Protein 8.0 6.5 - 8.1 g/dL   Albumin 4.7 3.5 - 5.0 g/dL   AST 33 15 - 41 U/L   ALT 16 0 - 44 U/L   Alkaline Phosphatase 167 (H) 38 - 126 U/L   Total Bilirubin 1.2 0.3 - 1.2 mg/dL   GFR calc non Af Amer >60 >60 mL/min   GFR calc Af Amer >60 >60 mL/min   Anion gap 12 5 -  15  Troponin I - Once  Result Value Ref Range   Troponin I <0.03 <0.03 ng/mL  Lactic acid, plasma  Result Value Ref Range   Lactic Acid, Venous 1.2 0.5 - 1.9 mmol/L  CBC with Differential  Result Value Ref Range   WBC 7.4 4.0 - 10.5 K/uL   RBC 5.24 (H) 3.87 - 5.11 MIL/uL   Hemoglobin 15.9 (H) 12.0 - 15.0 g/dL   HCT 04.5 (H) 40.9 - 81.1 %   MCV 93.1 80.0 - 100.0 fL   MCH 30.3 26.0 - 34.0 pg   MCHC 32.6 30.0 - 36.0 g/dL   RDW 91.4 78.2 - 95.6 %   Platelets 163 150 - 400 K/uL   nRBC 0.0 0.0 - 0.2 %   Neutrophils Relative % 82 %   Neutro Abs 6.1 1.7 - 7.7 K/uL   Lymphocytes Relative 10 %   Lymphs Abs 0.7 0.7 - 4.0 K/uL   Monocytes Relative 6 %   Monocytes Absolute 0.5 0.1 - 1.0 K/uL   Eosinophils Relative 0 %   Eosinophils Absolute 0.0 0.0 - 0.5 K/uL   Basophils Relative 1 %   Basophils Absolute 0.0 0.0 - 0.1 K/uL   Immature Granulocytes 1 %   Abs Immature Granulocytes 0.04 0.00 - 0.07 K/uL  Protime-INR  Result Value Ref Range   Prothrombin Time 13.0 11.4 - 15.2 seconds   INR 1.0 0.8 - 1.2  Urinalysis, Routine w reflex microscopic  Result Value Ref Range   Color, Urine YELLOW YELLOW   APPearance CLEAR CLEAR   Specific Gravity, Urine >1.030 (H) 1.005 - 1.030   pH 5.5 5.0 - 8.0   Glucose, UA NEGATIVE NEGATIVE mg/dL   Hgb urine dipstick NEGATIVE NEGATIVE   Bilirubin Urine SMALL (A) NEGATIVE   Ketones, ur NEGATIVE NEGATIVE mg/dL   Protein, ur NEGATIVE NEGATIVE mg/dL   Nitrite NEGATIVE NEGATIVE   Leukocytes,Ua NEGATIVE NEGATIVE   Dg  Chest 2 View Result Date: 07/22/2018 CLINICAL DATA:  Weakness. EXAM: CHEST - 2 VIEW COMPARISON:  Chest x-ray dated October 31, 2017. FINDINGS: The heart size and mediastinal contours are within normal limits. Normal pulmonary vascularity. The lungs remain mildly hyperinflated. No focal consolidation, pleural effusion, or pneumothorax. No acute osseous abnormality. IMPRESSION: No active cardiopulmonary disease. Electronically Signed   By: Obie Dredge M.D.   On: 07/22/2018 20:37   Ct Head Wo Contrast Result Date: 07/22/2018 CLINICAL DATA:  Confusion. Orthostasis. Generalized muscle weakness. Dementia. EXAM: CT HEAD WITHOUT CONTRAST TECHNIQUE: Contiguous axial images were obtained from the base of the skull through the vertex without intravenous contrast. COMPARISON:  Multiple priors, including MR brain 03/13/2015, and CT head 03/19/2018. FINDINGS: Brain: No evidence for acute infarction, hemorrhage, mass lesion, or extra-axial fluid. Hydrocephalus ex vacuo. Remote LEFT posterior temporal hemorrhage, with resolution and subsequent encephalomalacia. Extensive chronic microvascular ischemic change. Vascular: Calcification of the cavernous internal carotid arteries consistent with cerebrovascular atherosclerotic disease. No signs of intracranial large vessel occlusion. Skull: Calvarium intact. Sinuses/Orbits: Minor posterior ethmoid air cell fluid on the LEFT. Negative orbits. Other: No middle ear or mastoid fluid. Scalp soft tissues grossly unremarkable. IMPRESSION: Atrophy and small vessel disease. No acute intracranial findings. No evidence of recent hemorrhage, or extra-axial fluid collection. Electronically Signed   By: Elsie Stain M.D.   On: 07/22/2018 20:34    2230:  IVF given for orthostatic VS with improvement. Pt has ambulated with steady gait, easy resps, NAD. Pt has tol PO well without N/V. Pt remains pleasantly confused, resps easy, abd soft/NT, neuro non-focal. Pt continues to say she feels "fine." No clear indication for admission at this time. Dx and testing d/w pt and family.  Questions answered.  Verb understanding, agreeable to d/c home with outpt f/u.     Final Clinical Impressions(s) / ED Diagnoses   Final diagnoses:  Dehydration  Dementia without behavioral disturbance, unspecified dementia type [facility])    ED Discharge Orders    None       Samuel Jester, DO 07/25/18 1551

## 2018-07-22 NOTE — ED Triage Notes (Addendum)
Daughter states "I think she is dehydrated. She has not eaten or drank much today. And her blood pressure dropped from a sitting position to a standing position. She has dementia but seems a little more confused today." Patient denies pain or symptoms at triage. States symptoms have been intermittent x 1 week and patient "gags" when attempting to eat or drink.

## 2018-07-24 LAB — URINE CULTURE: Culture: NO GROWTH

## 2018-08-04 ENCOUNTER — Emergency Department (HOSPITAL_COMMUNITY): Payer: Medicare Other

## 2018-08-04 ENCOUNTER — Other Ambulatory Visit: Payer: Self-pay

## 2018-08-04 ENCOUNTER — Emergency Department (HOSPITAL_COMMUNITY)
Admission: EM | Admit: 2018-08-04 | Discharge: 2018-08-05 | Disposition: A | Discharge status: Home / Self Care | Payer: Medicare Other | Attending: Emergency Medicine | Admitting: Emergency Medicine

## 2018-08-04 DIAGNOSIS — R630 Anorexia: Secondary | ICD-10-CM | POA: Diagnosis not present

## 2018-08-04 DIAGNOSIS — F0390 Unspecified dementia without behavioral disturbance: Secondary | ICD-10-CM | POA: Insufficient documentation

## 2018-08-04 DIAGNOSIS — Z79899 Other long term (current) drug therapy: Secondary | ICD-10-CM | POA: Diagnosis not present

## 2018-08-04 DIAGNOSIS — R531 Weakness: Secondary | ICD-10-CM

## 2018-08-04 DIAGNOSIS — I10 Essential (primary) hypertension: Secondary | ICD-10-CM | POA: Insufficient documentation

## 2018-08-04 DIAGNOSIS — Z8673 Personal history of transient ischemic attack (TIA), and cerebral infarction without residual deficits: Secondary | ICD-10-CM | POA: Diagnosis not present

## 2018-08-04 DIAGNOSIS — E860 Dehydration: Secondary | ICD-10-CM

## 2018-08-04 LAB — CBC WITH DIFFERENTIAL/PLATELET
Abs Immature Granulocytes: 0.08 10*3/uL — ABNORMAL HIGH (ref 0.00–0.07)
Basophils Absolute: 0.1 10*3/uL (ref 0.0–0.1)
Basophils Relative: 1 %
Eosinophils Absolute: 0.1 10*3/uL (ref 0.0–0.5)
Eosinophils Relative: 1 %
HCT: 48.7 % — ABNORMAL HIGH (ref 36.0–46.0)
Hemoglobin: 15.6 g/dL — ABNORMAL HIGH (ref 12.0–15.0)
Immature Granulocytes: 1 %
Lymphocytes Relative: 9 %
Lymphs Abs: 0.9 10*3/uL (ref 0.7–4.0)
MCH: 30.8 pg (ref 26.0–34.0)
MCHC: 32 g/dL (ref 30.0–36.0)
MCV: 96.1 fL (ref 80.0–100.0)
Monocytes Absolute: 0.6 10*3/uL (ref 0.1–1.0)
Monocytes Relative: 6 %
Neutro Abs: 8.3 10*3/uL — ABNORMAL HIGH (ref 1.7–7.7)
Neutrophils Relative %: 82 %
Platelets: 191 10*3/uL (ref 150–400)
RBC: 5.07 MIL/uL (ref 3.87–5.11)
RDW: 15.6 % — ABNORMAL HIGH (ref 11.5–15.5)
WBC: 10 10*3/uL (ref 4.0–10.5)
nRBC: 0 % (ref 0.0–0.2)

## 2018-08-04 LAB — COMPREHENSIVE METABOLIC PANEL
ALT: 15 U/L (ref 0–44)
AST: 19 U/L (ref 15–41)
Albumin: 4.1 g/dL (ref 3.5–5.0)
Alkaline Phosphatase: 159 U/L — ABNORMAL HIGH (ref 38–126)
Anion gap: 14 (ref 5–15)
BUN: 23 mg/dL (ref 8–23)
CO2: 22 mmol/L (ref 22–32)
Calcium: 9.5 mg/dL (ref 8.9–10.3)
Chloride: 102 mmol/L (ref 98–111)
Creatinine, Ser: 1.15 mg/dL — ABNORMAL HIGH (ref 0.44–1.00)
GFR calc Af Amer: 55 mL/min — ABNORMAL LOW (ref >60)
GFR calc non Af Amer: 47 mL/min — ABNORMAL LOW (ref >60)
Glucose, Bld: 113 mg/dL — ABNORMAL HIGH (ref 70–99)
Potassium: 3.2 mmol/L — ABNORMAL LOW (ref 3.5–5.1)
Sodium: 138 mmol/L (ref 135–145)
Total Bilirubin: 1.1 mg/dL (ref 0.3–1.2)
Total Protein: 7.3 g/dL (ref 6.5–8.1)

## 2018-08-04 LAB — LACTIC ACID, PLASMA: Lactic Acid, Venous: 1.3 mmol/L (ref 0.5–1.9)

## 2018-08-04 MED ORDER — SODIUM CHLORIDE 0.9 % IV BOLUS
1000.0000 mL | Freq: Once | INTRAVENOUS | Status: AC
  Administered 2018-08-04: 1000 mL via INTRAVENOUS

## 2018-08-04 MED ORDER — SODIUM CHLORIDE 0.9 % IV BOLUS
1000.0000 mL | Freq: Once | INTRAVENOUS | Status: AC
  Administered 2018-08-04: 23:00:00 1000 mL via INTRAVENOUS

## 2018-08-04 MED ORDER — POTASSIUM CHLORIDE CRYS ER 20 MEQ PO TBCR
40.0000 meq | EXTENDED_RELEASE_TABLET | Freq: Once | ORAL | Status: AC
  Administered 2018-08-04: 40 meq via ORAL
  Filled 2018-08-04: qty 2

## 2018-08-04 NOTE — ED Notes (Addendum)
Per patients POA pt is a DNR. Daughter doesn't have the paper with her at this time.

## 2018-08-04 NOTE — ED Triage Notes (Signed)
Pt brought in by daughter who states that she hasn't been eating good for 2 weeks. Was being treated for UTI but didn't finish the course of abx. Pt has fallen x2 today, once this morning and once tonight.

## 2018-08-04 NOTE — ED Provider Notes (Signed)
[facility] EMERGENCY DEPARTMENT Provider Note   CSN: [id number] Arrival date & time: 08/04/18  2131    History   Chief Complaint Chief Complaint  Patient presents with  . Fall    x2  . Weakness    HPI Shawna Banks is a 73 y.o. female.     HPI   She presents for evaluation of weakness, leading to falling.  She has had decreased oral intake, by report.  Patient cannot contribute to history.  Level 5 caveat- dementia  Past Medical History:  Diagnosis Date  . Dementia (HCC)   . 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 Hypercholesteremia   . Hypertension   . Stroke [facility])     Patient Active Problem List   Diagnosis Date Noted  . 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 Dementia (HCC) 03/15/2015  . Thrombocytopenia (HCC) 03/15/2015  . Hypokalemia 03/14/2015  . Seizures (HCC)   . Cerebral amyloid angiopathy (HCC)   . HLD (hyperlipidemia)   . Essential hypertension   . 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 Proximal humerus fracture 02/28/2014    No past surgical history on file.   OB History   No obstetric history on file.      Home Medications    Prior to Admission medications   Medication Sig Start Date End Date Taking? Authorizing Provider  [employer]S VITAMIN B12 1000 MCG TBCR Take 1 tablet by mouth daily.  04/03/18  Yes [provider]  enalapril (VASOTEC) 20 MG tablet Take 10 mg by mouth daily as needed (for high blood pressure levels).    Yes [provider]  hydrochlorothiazide (HYDRODIURIL) 25 MG tablet Take 25 mg by mouth daily as needed (for high blood pressure levels).   Yes [provider]  levETIRAcetam (KEPPRA) 750 MG tablet Take 750 mg by mouth 2 (two) times daily.   Yes [provider]  NAMZARIC 28-10 MG CP24 Take 1 capsule by mouth every evening.  03/08/18   Yes [provider]  potassium chloride (K-DUR) 10 MEQ tablet Take 10 mEq by mouth daily. 06/24/18  Yes [provider]  simvastatin (ZOCOR) 40 MG tablet Take 40 mg by mouth at bedtime.    Yes [provider]  Vitamin D, Ergocalciferol, (DRISDOL) 50000 UNITS CAPS capsule Take 50,000 Units by mouth every 30 (thirty) days.    Yes [provider]  acetaminophen (TYLENOL) 500 MG tablet Take 500-1,000 mg by mouth every 6 (six) hours as needed for mild pain or moderate pain.    [provider]  ciprofloxacin (CIPRO) 500 MG tablet Take 500 mg by mouth 2 (two) times daily. 15 day course starting on 07/06/2018 07/06/18   [provider]  sulfamethoxazole-trimethoprim (BACTRIM DS,SEPTRA DS) 800-160 MG tablet Take 1 tablet by mouth 2 (two) times daily. 10 day course starting on 07/21/2018--STOPPED and changed to CIPRO 07/03/18   [provider]    Family History Family History  Problem Relation Age of Onset  . Hypertension Mother   . Heart attack Mother   . Lung cancer Sister   . Hypertension Father   . Cancer Sister     Social History Social History   Tobacco Use  . Smoking status: Never Smoker  . Smokeless tobacco: Never Used  Substance Use Topics  . Alcohol use: No  . Drug use: No  Allergies   Patient has no known allergies.   Review of Systems Review of Systems  Unable to perform ROS: Mental status change     Physical Exam Updated Vital Signs BP 95/69 (BP Location: Right Arm)   Pulse 72   Temp 98 F (36.7 C) (Oral)   Resp 17   Ht 5\' 6"  (1.676 m)   Wt 49.9 kg   SpO2 97%   BMI 17.76 kg/m   Physical Exam Vitals signs and nursing note reviewed.  Constitutional:      General: She is not in acute distress.    Appearance: Normal appearance. She is well-developed and normal weight. She is not ill-appearing, toxic-appearing or diaphoretic.  HENT:     Head: Normocephalic and atraumatic.     Right Ear: External ear  normal.     Left Ear: External ear normal.     Nose: No congestion or rhinorrhea.     Mouth/Throat:     Mouth: Mucous membranes are moist.     Pharynx: No oropharyngeal exudate or posterior oropharyngeal erythema.  Eyes:     Conjunctiva/sclera: Conjunctivae normal.     Pupils: Pupils are equal, round, and reactive to light.  Neck:     Musculoskeletal: Normal range of motion and neck supple.     Trachea: Phonation normal.  Cardiovascular:     Rate and Rhythm: Normal rate and regular rhythm.     Heart sounds: Normal heart sounds. No murmur.     Comments: Hypotension is present Pulmonary:     Effort: Pulmonary effort is normal. No respiratory distress.     Breath sounds: Normal breath sounds. No stridor. No rhonchi.  Chest:     Chest wall: No tenderness.  Abdominal:     General: There is no distension.     Palpations: Abdomen is soft. There is no mass.     Tenderness: There is no abdominal tenderness.     Hernia: No hernia is present.  Musculoskeletal: Normal range of motion.        General: No swelling, tenderness or deformity.  Skin:    General: Skin is warm and dry.     Coloration: Skin is not jaundiced or pale.  Neurological:     Mental Status: She is alert and oriented to person, place, and time.     Cranial Nerves: No cranial nerve deficit.     Sensory: No sensory deficit.     Motor: No abnormal muscle tone.     Coordination: Coordination normal.  Psychiatric:        Mood and Affect: Mood normal.        Behavior: Behavior normal.      ED Treatments / Results  Labs (all labs ordered are listed, but only abnormal results are displayed) Labs Reviewed  COMPREHENSIVE METABOLIC PANEL - Abnormal; Notable for the following components:      Result Value   Potassium 3.2 (*)    Glucose, Bld 113 (*)    Creatinine, Ser 1.15 (*)    Alkaline Phosphatase 159 (*)    GFR calc non Af Amer 47 (*)    GFR calc Af Amer 55 (*)    All other components within normal limits  CBC WITH  DIFFERENTIAL/PLATELET - Abnormal; Notable for the following components:   Hemoglobin 15.6 (*)    HCT 48.7 (*)    RDW 15.6 (*)    Neutro Abs 8.3 (*)    Abs Immature Granulocytes 0.08 (*)    All other components  within normal limits  CULTURE, BLOOD (ROUTINE X 2)  CULTURE, BLOOD (ROUTINE X 2)  LACTIC ACID, PLASMA  LACTIC ACID, PLASMA  URINALYSIS, ROUTINE W REFLEX MICROSCOPIC    EKG None  Radiology Dg Chest Port 1 View  Result Date: 08/04/2018 CLINICAL DATA:  Weakness EXAM: PORTABLE CHEST 1 VIEW COMPARISON:  07/22/2018 FINDINGS: Cardiac shadows within normal limits. Aortic calcifications are noted. The lungs are mildly hyperinflated without focal infiltrate or effusion. No bony abnormality is noted. IMPRESSION: No acute abnormality noted. Electronically Signed   By: Alcide CleverMark  Lukens M.D.   On: 08/04/2018 22:18    Procedures Procedures (including critical care time)  Medications Ordered in ED Medications  sodium chloride 0.9 % bolus 1,000 mL (1,000 mLs Intravenous New Bag/Given 08/04/18 2217)     Initial Impression / Assessment and Plan / ED Course  I have reviewed the triage vital signs and the nursing notes.  Pertinent labs & imaging results that were available during my care of the patient were reviewed by me and considered in my medical decision making (see chart for details).  Clinical Course as of Aug 04 2302  Tue Aug 04, 2018  2227 I was able to reach the patient's daughter, Franne FortsBrenda angle, on the phone who gave me additional history.  He states that the patient has had poor oral intake for about 3 weeks and especially is not drinking much water.  About 4 weeks ago she was treated for a UTI with an antibiotic, prescribed for 14 days.  After about 7 days they stopped giving her the antibiotic because of suspected aggravation of oral intake.  Since then she is gotten gradually worse, and in the last 3 days has fallen twice.  Her daughter feels like she injured her lower back and that  there is a "knot on it, where she fell.  There has been no cough, or fever.  There is no additional available information, at this time.  Her daughter brought her here tonight for evaluation.   [EW]  2242 Normal except increased Hemoglobin  CBC with Differential(!) [EW]  2244 No CHF or Infiltrate; images reviewed by me.  DG Chest Port 1 View [EW]  2303 Orthostatic vital signs done at 10:22 PM, no significant change going from supine to standing.   [EW]    Clinical Course User Index [EW] Mancel BaleWentz, Brilee Port, MD        Patient Vitals for the past 24 hrs:  BP Temp Temp src Pulse Resp SpO2 Height Weight  08/04/18 2221 95/69 98 F (36.7 C) Oral 72 17 97 % - -  08/04/18 2136 - - - - - - 5\' 6"  (1.676 m) 49.9 kg  08/04/18 2134 94/62 97.8 F (36.6 C) Oral 98 16 99 % - -    Orthostatic VS - Lying from 08/03/18 2302 to 08/04/18 2302   Date/Time  08/04/18 2222  BP- Lying: 89/54  Pulse- Lying: 75  User: [facility]   Orthostatic VS - Sitting from 08/03/18 2302 to 08/04/18 2302   Date/Time  08/04/18 2222  BP- Sitting: 99/75  Pulse- Sitting: 72  Who: [facility]   Orthostatic VS - Standing from 08/03/18 2302 to 08/04/18 2302   Date/Time  08/04/18 2222  BP- Standing at 0 minutes: 106/67  Pulse- Standing at 0 minutes: 72  Who: [facility]            Medical Decision Making: Patient with vague and nonspecific symptoms, presenting with mild hypotension.  This is a recurrent presentation, recently evaluated  here for malaise, found to be orthostatic and treated with fluids then discharged.  Screening evaluation for serious bacterial infection and metabolic problems was been initiated, with empiric IV fluid given.  CRITICAL CARE-  NO Performed by: Mancel Bale   Nursing Notes Reviewed/ Care Coordinated Applicable Imaging Reviewed Interpretation of Laboratory Data incorporated into ED treatment   Plan-care to oncoming provider team to evaluate after return of labs and reassess after treatment with IV  fluids.    Final Clinical Impressions(s) / ED Diagnoses   Final diagnoses:  Weakness  Anorexia    ED Discharge Orders    None       Mancel Bale, MD 08/04/18 2304

## 2018-08-04 NOTE — ED Notes (Signed)
Water given  

## 2018-08-04 NOTE — ED Provider Notes (Addendum)
Care assumed from Dr. Effie Shy at 11 PM.  Patient with dementia and previous stroke here with generalized weakness and several falls.  Orthostatics are positive and she is getting IV fluids.  Labs show mild creatinine elevation and hypokalemia.  Urinalysis is negative.  Patient was given IV fluids with improvement in her blood pressure.  She is able to ambulate and tolerate p.o. without difficulty.  She denies any pain.  Patient is tolerating p.o. and ambulatory.  There is no clear indication for admission.  Continue p.o. hydration at home with recheck of electrolytes by PCP next week.  Potassium will be replaced.   EKG Interpretation  Date/Time:  Tuesday August 04 2018 23:22:32 EDT Ventricular Rate:  79 PR Interval:    QRS Duration: 125 QT Interval:  469 QTC Calculation: 538 R Axis:   85 Text Interpretation:  Sinus tachycardia Multiform ventricular premature complexes Probable left atrial enlargement Nonspecific intraventricular conduction delay Nonspecific T abnormalities, lateral leads Artifact in lead(s) I II III aVR aVL aVF V2 V3 V5 V6 Interpretation limited secondary to artifact Confirmed by Glynn Octave [phone]) on 08/04/2018 11:30:52 PM        BP 120/81 (BP Location: Left Arm)   Pulse 99   Temp 98 F (36.7 C) (Oral)   Resp 14   Ht 5\' 6"  (1.676 m)   Wt 49.9 kg   SpO2 98%   BMI 17.76 kg/m     Glynn Octave, MD 08/05/18 Estrella Myrtle    Glynn Octave, MD 08/17/18 [phone]

## 2018-08-04 NOTE — ED Notes (Signed)
Pt. Was able to ambulate from wheelchair at bedside to the bed with minimal assistance.

## 2018-08-05 ENCOUNTER — Emergency Department (HOSPITAL_COMMUNITY): Payer: Medicare Other

## 2018-08-05 LAB — URINALYSIS, ROUTINE W REFLEX MICROSCOPIC
Bilirubin Urine: NEGATIVE
Glucose, UA: NEGATIVE mg/dL
Hgb urine dipstick: NEGATIVE
Ketones, ur: 5 mg/dL — AB
Leukocytes,Ua: NEGATIVE
Nitrite: NEGATIVE
Protein, ur: NEGATIVE mg/dL
Specific Gravity, Urine: 1.03 (ref 1.005–1.030)
pH: 5 (ref 5.0–8.0)

## 2018-08-05 LAB — TROPONIN I: Troponin I: <0.03 ng/mL (ref <0.03)

## 2018-08-05 MED ORDER — POTASSIUM CHLORIDE ER 10 MEQ PO TBCR: 10.0000 meq | EXTENDED_RELEASE_TABLET | Freq: Two times a day (BID) | ORAL | 0 refills | Status: AC

## 2018-08-05 NOTE — ED Notes (Signed)
T/c to daughter to discuss change in potassium chloride taking 1 tablet 2 times daily

## 2018-08-05 NOTE — Discharge Instructions (Addendum)
Your urinalysis is negative.  Keep yourself hydrated.  Follow-up with your primary care doctor.  Return to the ED with chest pain, shortness of breath, change in mental status or other concerns.

## 2018-08-05 NOTE — ED Notes (Signed)
Discussed discharge with daughter and follow-up as pt has hx of dementia, daughter is caregiver

## 2018-08-07 LAB — URINE CULTURE: Culture: NO GROWTH

## 2018-08-09 LAB — CULTURE, BLOOD (ROUTINE X 2)
Culture: NO GROWTH
Culture: NO GROWTH
Special Requests: ADEQUATE
Special Requests: ADEQUATE

## 2018-09-21 DEATH — deceased

## 2020-01-29 IMAGING — CT CT HEAD WITHOUT CONTRAST
4 series · 17 of 47 positions shown, 19 images · non-contrast
Comparison: Multiple priors, including MR brain 03/13/2015, and CT
head 03/19/2018.

CLINICAL DATA: Confusion. Orthostasis. Generalized muscle weakness.
Dementia.

EXAM:
CT HEAD WITHOUT CONTRAST
TECHNIQUE: Contiguous axial images were obtained from the base of the skull
through the vertex without intravenous contrast.

[Series 2: head trauma wo · axial · 0.42mm/px · z∈[+1458,+1573]mm · 7 of 31 slices shown, 9 images]
[im 4/31  brain]
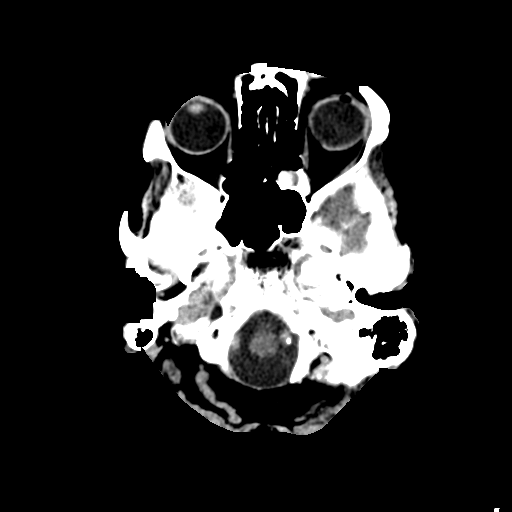
[im 4/31  bone]
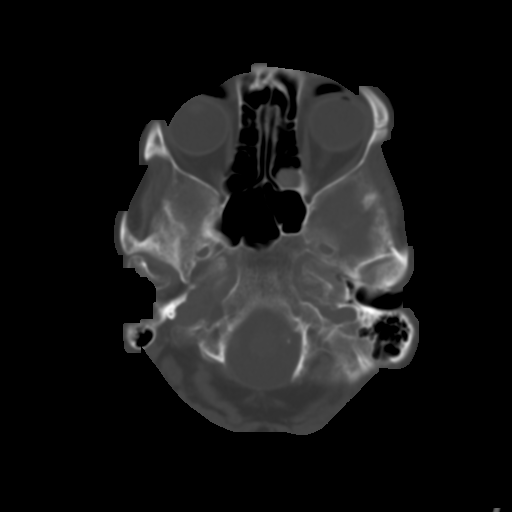
[im 8/31  brain]
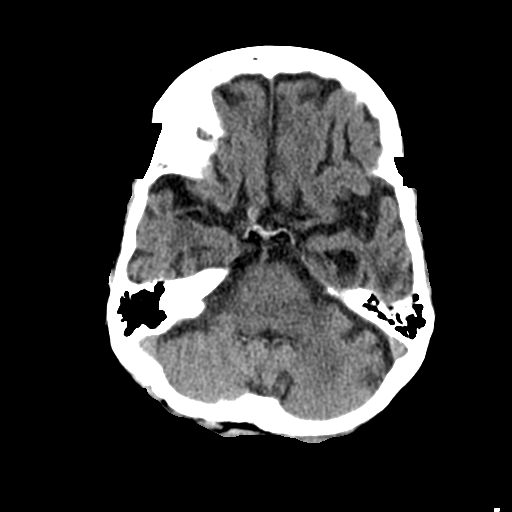
[im 12/31  brain]
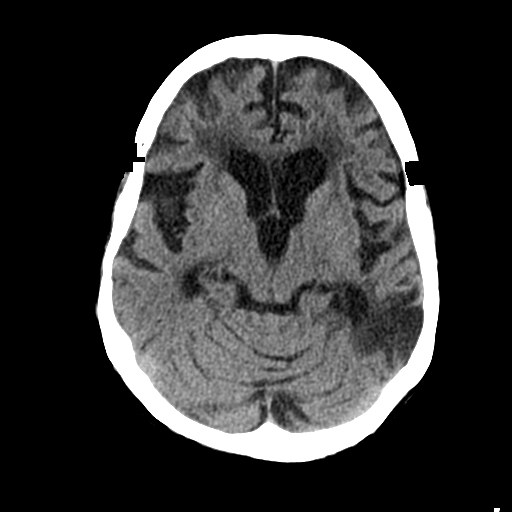
[im 16/31  brain]
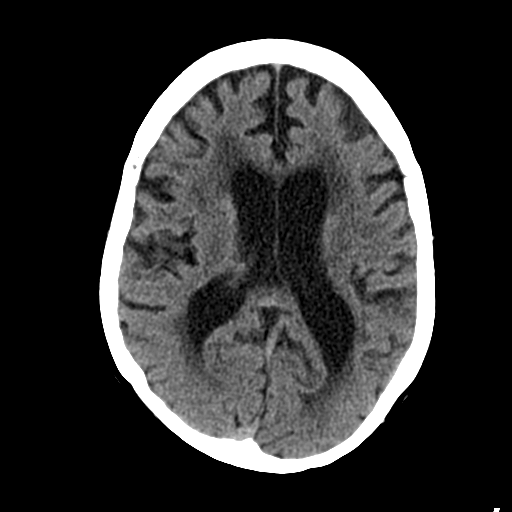
[im 19/31  brain]
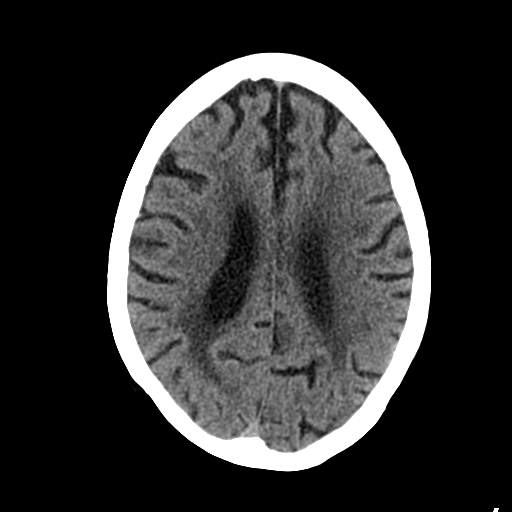
[im 19/31  bone]
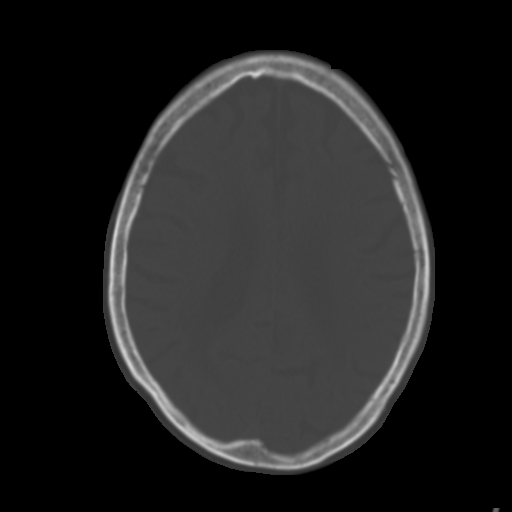
[im 23/31  brain]
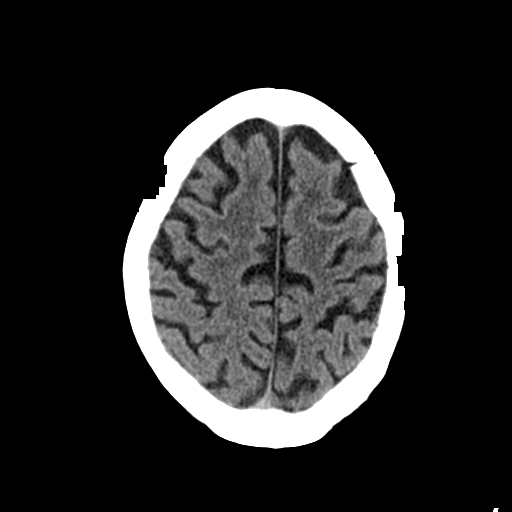
[im 27/31  brain]
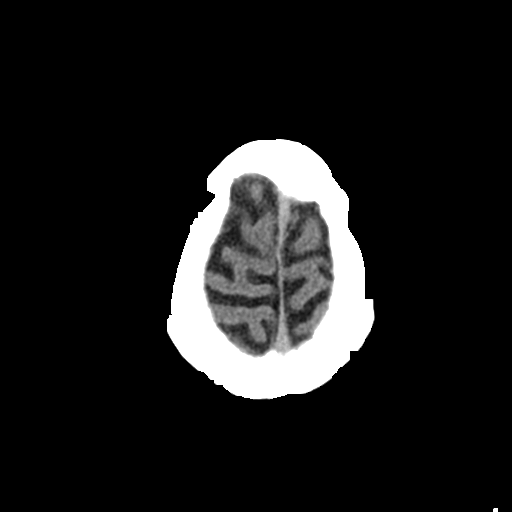

[Series 3: head bone · axial · 0.42mm/px · z∈[+1457,+1511]mm · 4 of 78 slices shown]
[im 8/78  bone]
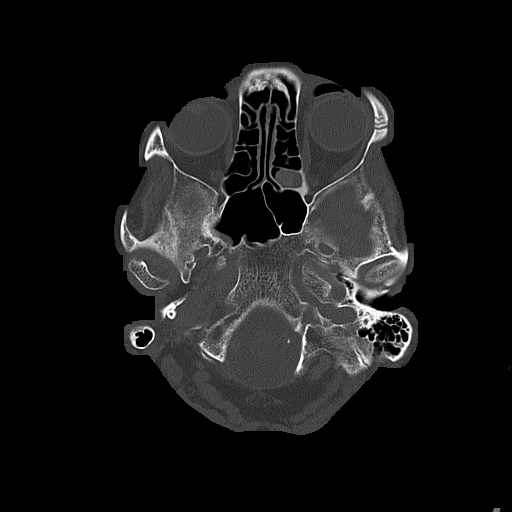
[im 16/78  bone]
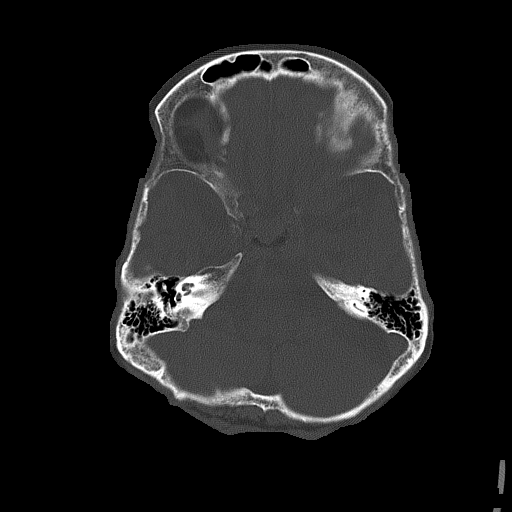
[im 24/78  bone]
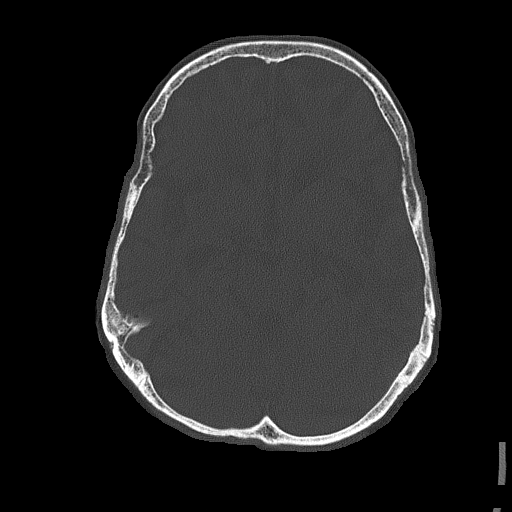
[im 35/78  bone]
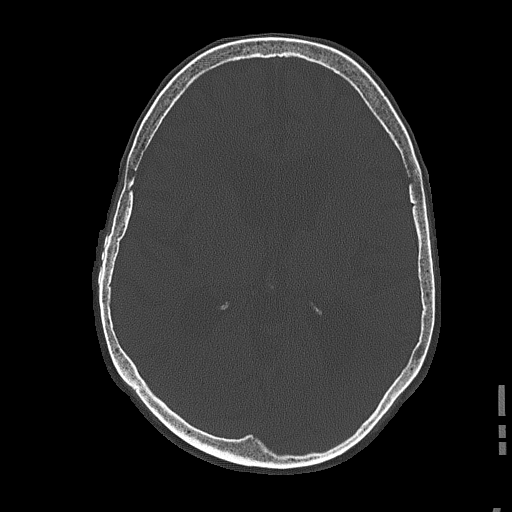

[Series 4: coronal soft tissue · coronal · 0.32mm/px · 3 of 63 slices shown]
[im 21/63  brain]
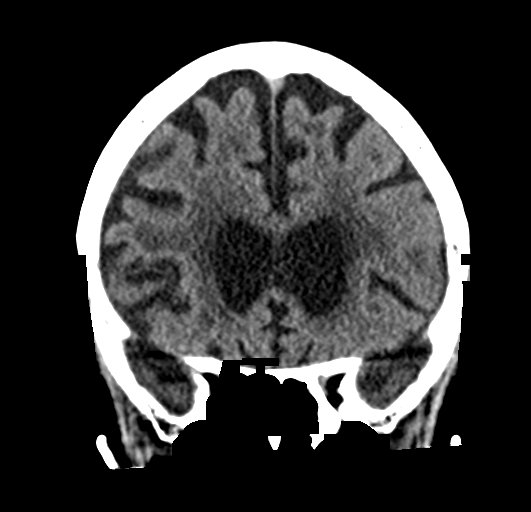
[im 28/63  brain]
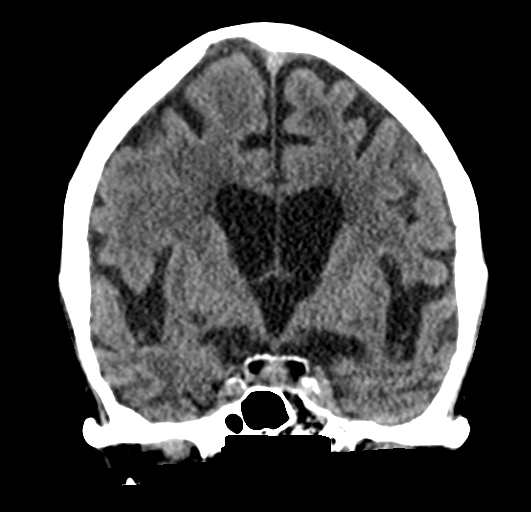
[im 35/63  brain]
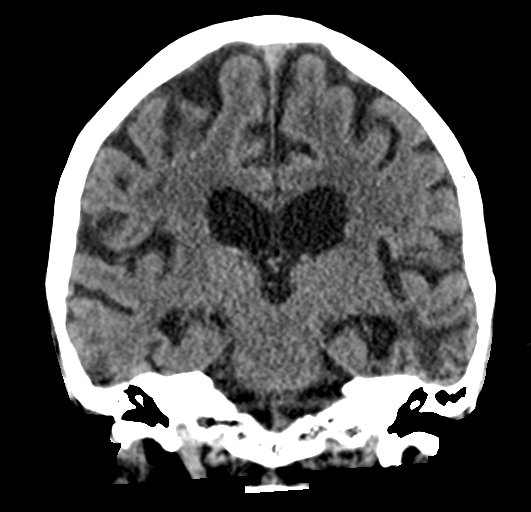

[Series 5: sagittal soft tissue · sagittal · 0.32mm/px · 3 of 57 slices shown]
[im 19/57  brain]
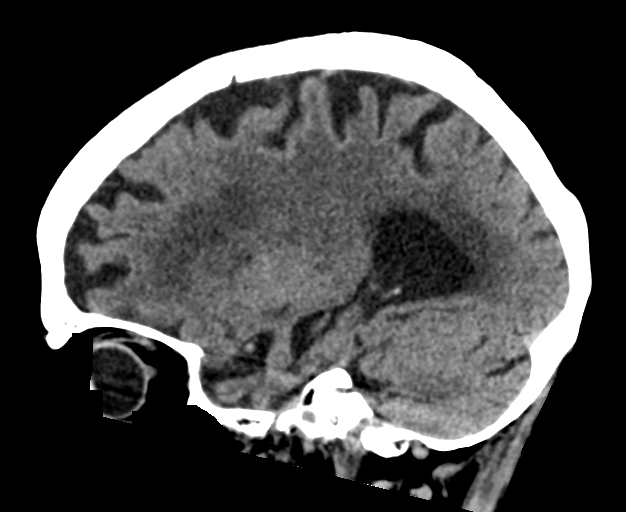
[im 29/57  brain]
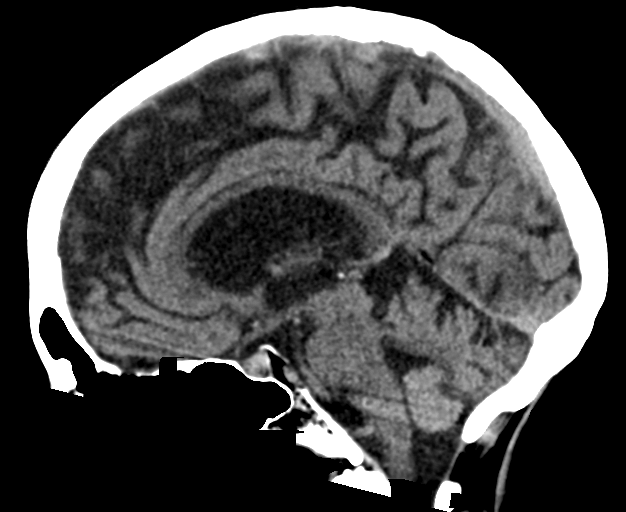
[im 38/57  brain]
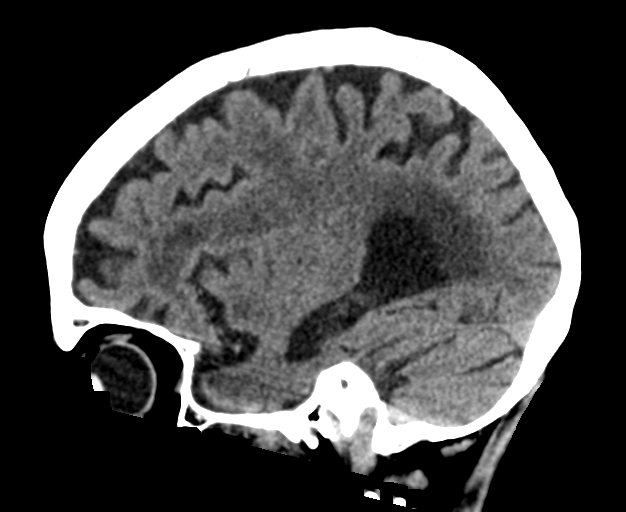

[17 of 47 positions shown; findings below may reference images not displayed]

FINDINGS: Brain: No evidence for acute infarction, hemorrhage, mass lesion, or
extra-axial fluid. Hydrocephalus ex vacuo. Remote LEFT posterior
temporal hemorrhage, with resolution and subsequent
encephalomalacia. Extensive chronic microvascular ischemic change.

Vascular: Calcification of the cavernous internal carotid arteries
consistent with cerebrovascular atherosclerotic disease. No signs of
intracranial large vessel occlusion.

Skull: Calvarium intact.

Sinuses/Orbits: Minor posterior ethmoid air cell fluid on the LEFT.
Negative orbits.

Other: No middle ear or mastoid fluid. Scalp soft tissues grossly
unremarkable.
IMPRESSION: Atrophy and small vessel disease. No acute intracranial findings. No
evidence of recent hemorrhage, or extra-axial fluid collection.
